# Patient Record
Sex: Male | Born: 1935 | Race: White | Hispanic: No | Marital: Married | Smoking: Never smoker
Health system: Southern US, Community
[De-identification: ages and names within clinical notes are randomized; demographics above are authoritative.]

## PROBLEM LIST (undated history)

## (undated) DIAGNOSIS — I259 Chronic ischemic heart disease, unspecified: Secondary | ICD-10-CM

## (undated) DIAGNOSIS — E785 Hyperlipidemia, unspecified: Secondary | ICD-10-CM

## (undated) DIAGNOSIS — I10 Essential (primary) hypertension: Secondary | ICD-10-CM

## (undated) DIAGNOSIS — I491 Atrial premature depolarization: Secondary | ICD-10-CM

## (undated) DIAGNOSIS — I493 Ventricular premature depolarization: Secondary | ICD-10-CM

## (undated) DIAGNOSIS — I350 Nonrheumatic aortic (valve) stenosis: Secondary | ICD-10-CM

## (undated) DIAGNOSIS — R413 Other amnesia: Secondary | ICD-10-CM

## (undated) DIAGNOSIS — I519 Heart disease, unspecified: Secondary | ICD-10-CM

## (undated) HISTORY — DX: Nonrheumatic aortic (valve) stenosis: I35.0

## (undated) HISTORY — PX: TONSILLECTOMY AND ADENOIDECTOMY: SUR1326

## (undated) HISTORY — DX: Essential (primary) hypertension: I10

## (undated) HISTORY — DX: Chronic ischemic heart disease, unspecified: I25.9

## (undated) HISTORY — DX: Ventricular premature depolarization: I49.3

## (undated) HISTORY — DX: Other amnesia: R41.3

## (undated) HISTORY — DX: Hyperlipidemia, unspecified: E78.5

## (undated) HISTORY — DX: Heart disease, unspecified: I51.9

## (undated) HISTORY — DX: Atrial premature depolarization: I49.1

---

## 1998-04-01 ENCOUNTER — Inpatient Hospital Stay (HOSPITAL_COMMUNITY): Admission: EM | Admit: 1998-04-01 | Discharge: 1998-04-04 | Payer: Self-pay | Admitting: *Deleted

## 1998-10-18 ENCOUNTER — Encounter: Payer: Self-pay | Admitting: Cardiology

## 1998-10-18 ENCOUNTER — Ambulatory Visit (HOSPITAL_COMMUNITY): Admission: RE | Admit: 1998-10-18 | Discharge: 1998-10-18 | Payer: Self-pay | Admitting: Cardiology

## 1999-10-15 HISTORY — PX: CORONARY ARTERY BYPASS GRAFT: SHX141

## 2000-04-10 ENCOUNTER — Ambulatory Visit (HOSPITAL_COMMUNITY): Admission: RE | Admit: 2000-04-10 | Discharge: 2000-04-10 | Payer: Self-pay | Admitting: Cardiology

## 2000-04-10 HISTORY — PX: CARDIAC CATHETERIZATION: SHX172

## 2000-04-24 ENCOUNTER — Encounter: Payer: Self-pay | Admitting: Surgery

## 2000-04-28 ENCOUNTER — Encounter: Payer: Self-pay | Admitting: Surgery

## 2000-04-28 ENCOUNTER — Inpatient Hospital Stay (HOSPITAL_COMMUNITY): Admission: RE | Admit: 2000-04-28 | Discharge: 2000-05-02 | Payer: Self-pay | Admitting: Surgery

## 2000-04-29 ENCOUNTER — Encounter: Payer: Self-pay | Admitting: Surgery

## 2000-05-01 ENCOUNTER — Encounter: Payer: Self-pay | Admitting: Surgery

## 2000-06-24 ENCOUNTER — Encounter (HOSPITAL_COMMUNITY): Admission: RE | Admit: 2000-06-24 | Discharge: 2000-09-22 | Payer: Self-pay | Admitting: Cardiology

## 2002-01-27 ENCOUNTER — Encounter: Payer: Self-pay | Admitting: Surgery

## 2002-01-28 ENCOUNTER — Encounter (INDEPENDENT_AMBULATORY_CARE_PROVIDER_SITE_OTHER): Payer: Self-pay | Admitting: Specialist

## 2002-01-28 ENCOUNTER — Ambulatory Visit (HOSPITAL_COMMUNITY): Admission: RE | Admit: 2002-01-28 | Discharge: 2002-01-28 | Payer: Self-pay | Admitting: Surgery

## 2002-02-25 ENCOUNTER — Encounter (INDEPENDENT_AMBULATORY_CARE_PROVIDER_SITE_OTHER): Payer: Self-pay | Admitting: *Deleted

## 2002-02-25 ENCOUNTER — Encounter: Payer: Self-pay | Admitting: Surgery

## 2002-02-25 ENCOUNTER — Inpatient Hospital Stay (HOSPITAL_COMMUNITY): Admission: RE | Admit: 2002-02-25 | Discharge: 2002-02-26 | Payer: Self-pay | Admitting: Surgery

## 2005-06-10 ENCOUNTER — Ambulatory Visit (HOSPITAL_COMMUNITY): Admission: RE | Admit: 2005-06-10 | Discharge: 2005-06-10 | Payer: Self-pay | Admitting: Gastroenterology

## 2007-04-29 HISTORY — PX: US ECHOCARDIOGRAPHY: HXRAD669

## 2007-08-24 HISTORY — PX: CARDIOVASCULAR STRESS TEST: SHX262

## 2010-05-21 ENCOUNTER — Encounter: Admission: RE | Admit: 2010-05-21 | Discharge: 2010-05-21 | Payer: Self-pay | Admitting: Cardiology

## 2010-05-21 ENCOUNTER — Ambulatory Visit: Payer: Self-pay | Admitting: Cardiology

## 2010-10-25 ENCOUNTER — Encounter
Admission: RE | Admit: 2010-10-25 | Discharge: 2010-10-25 | Payer: Self-pay | Source: Home / Self Care | Attending: Cardiology | Admitting: Cardiology

## 2010-10-26 ENCOUNTER — Ambulatory Visit: Payer: Self-pay | Admitting: Cardiology

## 2010-11-05 ENCOUNTER — Other Ambulatory Visit: Payer: Self-pay | Admitting: Dermatology

## 2010-11-22 ENCOUNTER — Ambulatory Visit (INDEPENDENT_AMBULATORY_CARE_PROVIDER_SITE_OTHER): Payer: Medicare Other | Admitting: Cardiology

## 2010-11-22 DIAGNOSIS — E7800 Pure hypercholesterolemia, unspecified: Secondary | ICD-10-CM

## 2010-11-22 DIAGNOSIS — I119 Hypertensive heart disease without heart failure: Secondary | ICD-10-CM

## 2010-11-22 DIAGNOSIS — Z79899 Other long term (current) drug therapy: Secondary | ICD-10-CM

## 2011-02-04 ENCOUNTER — Other Ambulatory Visit: Payer: Self-pay | Admitting: *Deleted

## 2011-02-04 DIAGNOSIS — F0390 Unspecified dementia without behavioral disturbance: Secondary | ICD-10-CM

## 2011-02-04 MED ORDER — DONEPEZIL HCL 10 MG PO TABS: 10.0000 mg | ORAL_TABLET | Freq: Every day | ORAL | Status: DC

## 2011-02-04 NOTE — Telephone Encounter (Signed)
Refilled meds per fax request.  

## 2011-02-05 ENCOUNTER — Other Ambulatory Visit: Payer: Self-pay | Admitting: *Deleted

## 2011-02-05 DIAGNOSIS — E7800 Pure hypercholesterolemia, unspecified: Secondary | ICD-10-CM

## 2011-02-05 MED ORDER — EZETIMIBE-SIMVASTATIN 10-20 MG PO TABS: 1.0000 | ORAL_TABLET | Freq: Every day | ORAL | Status: DC

## 2011-02-05 NOTE — Telephone Encounter (Signed)
Refilled meds per fax request.  

## 2011-03-01 NOTE — Cardiovascular Report (Signed)
[place]. [facility]  Patient:    Frank Gates, Frank Gates                        MRN: [id number] Proc. Date: 04/10/00 Adm. Date:  [id number] Disc. Date: [id number] Attending:  Norman Clay CC:         Clovis Pu Patty Sermons, M.D.                        Cardiac Catheterization  HISTORY:  A 77 year old male with some fatigue, who had an abnormal stress Cardiolite study with inferolateral ischemia and a depressed ejection fraction.  COMMENTS ABOUT PROCEDURE:  The patient tolerated the procedure well without complications.  Following the procedure the right femoral artery underwent percutaneous suture-meditated closure with the Perclose device.  He tolerated the procedure well.  HEMODYNAMIC DATA: 1. Aortic Pressure:  (post-contrast) 166/86. 2. Left ventricular pressure:  (post-contrast) 166/15.  ANGIOGRAPHIC DATA:  LEFT VENTRICULOGRAM:  Performed in the 30-degree RAO projection.  The ventricle is mildly dilated.  There is inferobasilar and diaphragmatic hypokinesis noted.  The ejection fraction is estimated at 44-45%.  CORONARY ANGIOGRAPHY:  Coronary arteries arise and distribute normally. Significant calcification noted involving the proximal left main and LAD system, as well as the right coronary artery. 1. LEFT MAIN CORONARY ARTERY:  Calcified.  With a mild distal narrowing    present. 2. LEFT ANTERIOR DESCENDING ARTERY:  Has a moderately severe calcified    ostial stenosis, estimated at 60-70% (which is eccentric).  The first    diagonal branch has a 60-70% ostial narrowing. 3. CIRCUMFLEX:  Has a first marginal branch which has a segmental 80%,    followed by an 80-90% stenosis in a bend.  A second marginal branch    arises.  The circumflex is then totally occluded and fills via    collaterals from the right coronary artery. 4. RIGHT CORONARY ARTERY:  Has a severe 80% proximal stenosis, and an 80%    distal stenosis prior to the posterior descending  artery.  IMPRESSION: 1. Significant three-vessel coronary atherosclerotic heart disease. 2. Abnormal left ventricular function, with inferobasilar hypokinesis. DD:  04/10/00 TD:  04/11/00 Job: [id number]

## 2011-03-01 NOTE — H&P (Signed)
[place]. [facility]  Patient:    Frank Gates, Frank Gates                        MRN: [id number] Adm. Date:  [id number] Attending:  Cleatrice Burke Dictator:   Frank Gates, P.A. CC:         Frank Gates, M.D.             Dr. Alben Spittle                         History and Physical  CHIEF COMPLAINT:  Multiple lung masses.  HISTORY OF PRESENT ILLNESS:  [name] referred by Dr. Jimmy Gates for evaluation of a pulmonary mass.  Patient was seen at [facility] secondary to problems related to fibromyalgia. An incidental chest x-ray revealed this abnormality on April 23, 2000.  A CT revealed multiple pleural base nodules, the largest of 2 cm at the anterior right base.  Also some intraparenchymal nodules measuring less than 1 cm were seen, as well, multiple lymphadenopathy in the perivascular space of less than 1 cm was observed.  The patient was referred to Dr. Edwyna Gates for evaluation. PFTs at the office showed an FEV1 of 4.72 and FBC of 5.52.  Dr. Edwyna Gates recommended left VATS with biopsy x 3 for pathologic evaluation.  Differential diagnosis include granulomatosis versus inflammatory lung disease, i.e., rheumatoid arthritis versus sarcoidosis or possible metastasis.  The patient returns to the office prior to the admission, denying any cough, sputum production, hemoptysis, fever or chills.  No sore throat, angina or arrhythmias.  No palpitations.  No symptoms of TIA, CVA or amaurosis fugax. No shortness of breath, dyspnea on exertion, or paroxysmal nocturnal dyspnea. No weight loss.  PAST MEDICAL HISTORY: 1. Fibromyalgia. 2. Arthritis. 3. Morbid obesity.  SURGERIES:  Status post vasectomy in 1999.  MEDICATIONS:  None.  ALLERGIES:  No known drug allergies.  REVIEW OF SYSTEMS:  See HPI and past medical history for significant positives; otherwise, the patient denies any diabetes, kidney disease, asthma, COPD, TIA, CVA, amaurosis  fugax, syncope, presyncope, CAD, angina, or arrhythmias.  No MI, PE, DVT.  No GI bleed, dysuria, or hematuria.  No GERD symptoms.  No CHF or hypertension.  No hypercholesterolemia.  No pneumonia, pneumothorax or bronchitis.  FAMILY HISTORY:  Noncontributory.  The patients parents both died when he was a child.  SOCIAL HISTORY:  Married, two children, one died secondary to leukemia.  He works as a [employer] manager.  He denies any tobacco intake, although for quite some time, he was a second-hand smoker at the factory.  He denies any alcohol intake.  PHYSICAL EXAMINATION:  GENERAL:  Well-developed, well-nourished 77 year old white male in no acute distress.  Alert and oriented x 3.  VITAL SIGNS:  Blood pressure 120/80, pulse 68, respirations 18.  HEAD:  Normocephalic, atraumatic.  PERRLA, EOMI.  There is mild contact dermatitis on the forehead secondary to the use of a hat on a continuous basis.  NECK: Supple, no JVD, no thyromegaly or lymphadenopathy.  CHEST:  Symmetrical on inspirations with no wheezes, rhonchi, there is a trace of dry rales on the right side.  No lymphadenopathy.  CARDIOVASCULAR:  Regular rate and rhythm with no murmurs, rubs or gallops.  ABDOMEN:  Soft, nontender, bowel sounds x 4, no hepatosplenomegaly or abdominal bruits.  GU/RECTAL:  Deferred.  EXTREMITIES:  No clubbing, cyanosis.  There is  trace edema, TENS, bilaterally.  SKIN:  Revealed no ulcerations.  Normal hair pattern.  Warm temperature.  PULSES:  Carotids 2+ bilaterally without bruits.  Femorals 2+ bilaterally. Popliteal dorsalis pedis and posterior tibialis 2+ bilaterally.  NEUROLOGIC:  Grossly normal.  Normal gait.  DTRs 2+ bilaterally.  Muscle strength 5/5.  ASSESSMENT/PLAN:  A 77 year old white male with found multiple masses, who will undergo left VATS and biopsy x 3 by Dr. Edwyna Gates on May 05, 2000. Dr. Edwyna Gates has seen and evaluated this patient prior to the admission and  has explained the risks, benefits involved in the procedure and the patient has decided to continue. DD:  05/01/00 TD:  05/01/00 Job: [id number]

## 2011-03-01 NOTE — Discharge Summary (Signed)
[place]. [facility]  Patient:    Frank Gates, SALMONS                        MRN: [id number] Adm. Date:  [id number] Disc. Date: 05/02/00 Attending:  Cleatrice Burke Dictator:   Marlowe Kays, P.A. CC:         Alleen Borne, M.D.                           Discharge Summary  DATE OF BIRTH:  03-23-1926  DATE OF SURGERY:  April 28, 2000  DISCHARGE DIAGNOSES: 1. Coronary artery disease status post coronary artery bypass graft. 2. History of hypertension. 3. History of gastroesophageal reflux disease symptoms. 4. Postoperative anemia. 5. Peripheral edema.  PROCEDURES:  Status post CABG times six with LIMA to the LAD, SVG to the diagonal, sequential SVG to the OM1 to the OM2 to the distal circumflex and SVG to the PDA.  DISCHARGE MEDICATIONS:  Lasix 20 mg one p.o. for three days.  K-Dur 10 mEq one p.o. q.d. for three days. Pepcid 20 mg one p.o. b.i.d.  Atenolol 12.5 mg one p.o. b.i.d.  Ferrous sulfate 325 mg p.o. b.i.d.  Multivitamins with folic acid q.d.  Darvocet N 100 one or two p.o. q.4-6h. p.r.n. for pain.  COMPLICATIONS:  None.  CONSULTATIONS:  None.  ALLERGIES:  No known drug allergies.  FOLLOW-UP:  With Dr. Patty Sermons in two weeks.  With Dr. Laneta Simmers May 20, 2000 at 9:15 a.m.  The patient will bring chest x-rays with him.  HISTORY OF PRESENT ILLNESS:  A 77 year old white male who is very active.  He had a routine history and physical recently, and related that he was not feeling up to his normal self with some fatigue and malaise.  He had no chest pain or shortness of breath.  Cardiolite scan showed left ventricular ejection fraction reduced to 30%, with a small area of inferolateral ischemia.  He had underwent cardiac catheterization on April 10, 2000 which showed significant three vessel coronary artery disease.  His ejection fraction was 45%.  After review of the angiograms and examination of the patient by Dr. Laneta Simmers, it was felt that  coronary artery bypass grafting was the best treatment.  HOSPITAL COURSE:  On April 28, 2000, the patient underwent CABG times six, with a LIMA to the LAD SVG to the diagonal and sequential SVG to the OM1, OM2 and distal circumflex and SVG to the PDA by Dr. Laneta Simmers with no complications.  The patient tolerated the procedure well and was transferred to the SICU in stable condition.  Later that evening, he was awake and extubated.  His CT drainage was minimal.  His blood count was normal.  He remained stable the morning of April 29, 2000.  He was doing well status post CABG.  His MTs were discontinued.  The left pleural tube was kept for another day.  Diuresis was to be continued.  He was transferred to the unit 2000 in stable condition.  On April 30, 2000 the patient remained stable.  His vital signs were stable. He was afebrile. His saturation was about 90% on room air.  He had minimal CT drainage, for which his CT was discontinued.  His telemetry showed sinus rhythm.  Overall he was doing well, with the exception of slight postoperative anemia, with a hemoglobin of 9 and a hematocrit of 25.3.  Ferrous  sulfate therapy was started.  He continued to ambulate, with no complications.  On May 01, 2000 the patients condition continued to improve.  His vital signs were stable.  His telemetry showed sinus rhythm and he was afebrile.  He is slowly returning to his preoperative weight of 194.  If the patient remains in stable condition on May 02, 2000, then his pacing wires will be removed and if no abnormalities arise it is predicted that the patient will be discharged in stable condition on postoperative day four. DD:  05/01/00 TD:  05/01/00 Job: [id number]

## 2011-03-01 NOTE — Op Note (Signed)
[place]. [facility]  Patient:    Frank Gates, Frank Gates                        MRN: [id number] Proc. Date: 04/28/00 Adm. Date:  [id number] Attending:  Cleatrice Burke CC:         Alleen Borne, M.D.             CVTS Office                           Operative Report  PREOPERATIVE DIAGNOSIS:  Severe three-vessel coronary artery disease with ischemia by Cardiolite scan.  POSTOPERATIVE DIAGNOSIS:  Severe three-vessel coronary artery disease with ischemia by Cardiolite scan.  OPERATIVE PROCEDURE: 1. Median sternotomy. 2. Extracorporeal circulation. 3. Coronary bypass graft surgery x 6 using the left internal mammary artery    graft, left anterior descending coronary artery, with saphenous vein    grafts to the diagonal branch of the LAD, a sequential saphenous vein graft    to the first and second obtuse marginal branches and then the distal left    circumflex coronary artery, and a saphenous vein graft to the posterior    descending branch of the right coronary artery.  ATTENDING SURGEON:  Alleen Borne, M.D.  ASSISTANT:  Lissa Merlin, P.A.-C.  ANESTHESIA:  General endotracheal.  CLINICAL HISTORY:  This patient is a 77 year old white male who is very active.  He had a routine history and physical recently and related that he was not feeling up to his normal self with some fatigue and malaise.  He had no chest pain or shortness of breath.  A Cardiolite scan showed left ventricular ejection fraction reduced to 30% with a small area of intralateral ischemia.  He underwent cardiac catheterization on April 10, 2000 which showed significant three-vessel coronary artery disease.  The left main was calcified with mild distal narrowing.  The LAD had 60-70% calcified eccentric ostial stenosis. First diagonal branch had 60-70% osteal narrowing.  The left circumflex had a first marginal and segmental 80% stenosis followed by an 80-90% stenosis in the bend of the left  circumflex before the second marginal branch.  The left circumflex was totally occluded beyond this with faint collateral from the right filling the distal vessel.  The right coronary artery had about 80% proximal stenosis and an 80% distal stenosis prior to the posterior descending branch.  Ejection fraction was 45%.  After review of the angiograms and examination of the patient it was felt that coronary artery bypass graft surgery was the best treatment.  I discussed the operative procedure with the patient and his wife including alternatives, benefits, risks including bleeding, possible blood transfusion, infection, stroke, myocardial infarction, and death.  They understood and agreed to proceed with surgery.  PROCEDURE IN DETAIL:  The patient was taken to the operating room and placed on the table in the supine position.  After induction of general endotracheal anesthesia a Foley catheter was placed in the bladder using the sterile technique.  Then, the chest, abdomen and both lower extremities were prepped and draped in the usual sterile manner.  Chest was entered through a median sternotomy incision and the pericardial muscle in the midline.  Examination of the heart showed good ventricular contractility.  The ascending aorta had no ostial stenosis in it.  Then the left internal mammary artery was harvested from the chest wall using pedicle graft.  This was a medium caliber with excellent blood flow through it.  At the same time, a 7 mm saphenous vein was harvested from the right leg and was of medium size and good quality.  Then the patient was heparinized and when an adequate axillary ______ was achieved he distal ascending aorta was cannulated using a 6.5 mm aortic cannula for arterial inflow.  Venous outflow was achieved using a two-stage venous cannula through the right atrial appendage.  An antegrade cardioplegia and vent cannula was inserted in the aortic root.  The  patient was placed on cardiopulmonary bypass and distal coronaries identified.  The LAD was a large graftable vessel.  The diagonal branch was a medium-sized graftable vessel.  The two marginal branches were lying close together, were medium sized graftable vessels with no distal disease in them. The distal left circumflex was a small vessel but was suitable for grafting. the right coronary artery was diffusely diseased extending up to the take-off of the posterior descending branch.  The posterior descending branch itself had no disease in it.  There were some small posterior lateral branches.  Then, the aorta was crossclamped and 500 cc of cold blood antegrade cardioplegia was administered in the aortic root with quick arrest of the heart.  Systemic hypothermia of 22 degrees centigrade and topical hypothermia iced saline was used.  A temperature probe was placed in the septum and an insulating pad in the pericardium.  The first distal anastomosis was performed of the first marginal branch.  The internal diameter was 1.6 mm.  The conduit used was 7 mm of saphenous vein. The anastomosis was formed in a sequential side-to-side manner using continuous 8-0 Prolene suture.  Flow was administered through the graft and was excellent.  The second distal anastomosis was performed of the second marginal branch. The internal diameter was 1.6 mm.  The conduit used was the same 7 mm saphenous vein.  The anastomosis was formed in a sequential side-to-side manner using continuous 8-0 Prolene suture.  Flow was again administered through the graft and was excellent.  Then, the third distal anastomosis was performed of the distal left circumflex coronary artery.  The internal diameter of this vessel was about 1.5 mm.  The conduit was the same 7 mm saphenous vein.  The anastomosis was performed in a sequential end-to-side manner using continuous 8-0 Prolene suture.  Flow was administered through the  graft and was excellent.  Then another dose of  cardioplegia was coming down the vein graft and in the aortic root.  The fourth distal anastomosis was then performed to the posterior descending branch of the right coronary artery.  The internal diameter was 1.75 mm.  The conduit was a second 7 mm saphenous vein.  The anastomosis was formed in an end-to-side manner using continuous 7-0 Prolene suture.  The flow was administered through the graft and was excellent.  Then, the fifth distal anastomosis was performed of the diagonal branch.  The internal diameter was 1.6 mm. The conduit used was a third 7 mm saphenous vein.  The anastomosis was formed in an end-to-side manner using continuous 7-0 Prolene suture.  Flow was administered through the graft and was excellent. Then another dose of cardioplegia was given down the vein grafts and in the aortic root.  The sixth distal anastomosis was performed in the midportion of the left anterior descending coronary artery.  The internal diameter was about 2 mm. The conduit used was the left internal mammary artery and this  was brought through an opening in the left pericardium anterior to the phrenic nerve.  It was anastomosed to the LAD in end-to-side manner using continuous 8-0 Prolene suture. The pedicle hole was tacked up with 6-0 Prolene sutures.  The patient was rewarmed to 37 degrees centigrade and clamp removed from the mammary pedicle.  There was rapid warming of the ventricular system and return of spontaneous ventricular fibrillation.  The crossclamp was removed with the time of 73 minutes and the patient defibrillated into sinus rhythm.  A partial occlusion clamp was placed on the artery root and the three crossal vein graft anastomoses were performed in an end-to-side manner using continuous suture of Prolene suture.  The clamp was removed and the vein graft deaired and clamps removed from them.  The proximal and distal  anastomosis appeared hemostatic and lie of the grafts satisfactory.  Graft markers placed on the proximal anastomoses.  Two temporary right ventricular and right atrial pacing wires were placed and brought out through the skin.  When the patient had rewarmed to 37 degrees centigrade, he was weaned from cardiopulmonary bypass on noninotropic agents.  Total bypass time was 112 minutes.  Cardiac function appeared excellent with cardiac output of 5 L/minute. Protamine was given and then venous and aortic cannulas were removed without difficulty.  Hemostasis was achieved.  Three chest tubes were placed with tubes in the posterior pericardium and one in the left pleural space and one in the anterior mediastinum.  The pericardium was reapproximated over the heart.  The sternum was closed with #6 stainless steel wire.  The fascia was closed with continuous #1 Vicryl suture.  The subcutaneous tissue was closed with continuous 2-0 Vicryl and the skin with 3-0 Vicryl subcuticular closure. The lower extremity vein harvest site was closed in a similar manner.  The sponge, needle and instrument counts were correct according to the scrub nurse.  Dry sterile dressings were applied over the incisions, around the chest tubes which were hooked to Pleur-evac suction.  The patient remained hemodynamically stable and was transported to the SICU in guarded but stable condition. DD:  04/28/00 TD:  04/28/00 Job: [id number]

## 2011-03-01 NOTE — Op Note (Signed)
[place]. [facility]  Patient:    Frank Gates, Frank Gates Visit Number: [id number] MRN: [id number]          Service Type: SUR Location: 2000 2017 01 Attending Physician:  Cleatrice Burke Dictated by:   Alleen Borne, M.D. Proc. Date: 02/25/02 Admit Date:  02/25/2002                             Operative Report  PREOPERATIVE DIAGNOSIS:  Right anterior chest wall mass.  POSTOPERATIVE DIAGNOSIS:  Right anterior chest wall mass.  OPERATION PERFORMED:  Resection of right anterior chest wall mass.  SURGEON:  Alleen Borne, M.D.  ASSISTANT:  Norton Blizzard, M.D.  ANESTHESIA:  General endotracheal.  INDICATIONS FOR PROCEDURE:  The patient is a 77 year old gentleman who underwent coronary artery bypass grafting surgery by me on April 28, 2000.  I saw him on January 26, 2002 after referral for development of an asymptomatic right anterior chest wall mass.  A CT scan of the chest showed a 3.1 x 4.4 cm soft tissue mass on the right anterior chest wall that appeared to be involving muscular layer in the chest wall and extending down between the ribs adjacent to the right side of the sternum.  There was no sign of invasion of the surrounding bones.  I performed an incisional biopsy of this on January 28, 2002.  This showed a spindle cell lesion most consistent with hypertrophic scar but Dr. Laurena Bering could not be certain that this was not a neurofibroma. There were no signs of malignancy.  I thought the best treatment for this would be surgical resection to prevent further growth and local invasion.  I discussed the operative procedure with the patient and his wife including alternatives, benefits, and risks including bleeding, blood transfusion, infection, stroke, myocardial infarction, need for reconstruction of the chest wall, and recurrence.  They understood and agreed to proceed.  DESCRIPTION OF PROCEDURE:  The patient was taken to the operating room and placed  on the table in the supine position.  After induction of general endotracheal anesthesia, a Foley catheter was placed in the bladder using sterile technique.  Then the chest was prepped with Betadine soap and solution and draped in the usual sterile manner.  Preoperative intravenous antibiotics were given.  Then a vertical incision was made and centered over the right anterior chest wall mass.  Dissection was continued down through the subcutaneous tissues to the pectoralis muscle.  Skin flaps were developed medially and laterally.  The pectoralis muscle was then divided horizontally along the course of its fibers.  Over the central part of the tumor, the muscle was adherent to it, probably from the previous biopsy.  Part of the pectoralis muscle was resected with the specimen in this area.  Otherwise the pectoralis muscle was easily separated from the chest wall mass.  The mass did not appear to be invading any structures.  It was firmly insinuated between the rib above and below it as well as the sternum medially.  Using electrocautery, we were able to excise this tumor, mass from the ribs and sternum without difficulty.  There did not appear to be any local invasion. The mass extended down to the intercostal muscle and in one area, I could visualize the right internal mammary artery pedicle at the depth of the resection.  The specimen was sent to pathology for permanent section.  There was complete hemostasis.  The pectoralis muscle was then reapproximated.  A small round Jackson-Pratt drain was brought through a separate stab incision and positioned above the muscle under the subcutaneous flaps.  Then the subcutaneous tissues was closed with continuous 3-0 Vicryl suture and the skin with 3-0 Vicryl subcuticular skin closure.  The sponge, needle and instrument counts were correct according to the scrub nurse.  A dry sterile dressing was applied over the incisions and around the drain  which was hooked to bulb suction.  The patient remained hemodynamically stable and was awakened and transported to the post anesthesia care unit extubated and in good condition. Dictated by:   Alleen Borne, M.D. Attending Physician:  Cleatrice Burke DD:  02/25/02 TD:  02/25/02 Job: [id number]

## 2011-03-01 NOTE — Op Note (Signed)
[facility]  Patient:    Frank Gates, LOSEE Visit Number: [id number] MRN: [id number]          Service Type: DSU Location: [facility] 2899 18 Attending Physician:  Cleatrice Burke Dictated by:   Alleen Borne, M.D. Proc. Date: 01/28/02 Admit Date:  01/28/2002 Discharge Date: 01/28/2002                             Operative Report  PREOPERATIVE DIAGNOSIS:  Right anterior chest wall mass.  POSTOPERATIVE DIAGNOSIS:  Right anterior chest wall mass.  OPERATION PERFORMED:  Incisional biopsy of right anterior chest wall mass.  SURGEON:  Alleen Borne, M.D.  ANESTHESIA:  General endotracheal.  INDICATIONS FOR PROCEDURE:  The patient is a 77 year old gentleman who underwent coronary artery bypass grafting surgery by me in the past couple of years.  He has done well postoperatively.  He presented to my office with a couple month history of right anterior chest wall mass that has been slowly enlarging.  This has caused him no symptoms.  CT scan of the chest had been obtained by Dr. Patty Sermons and showed a 4.4 x 3 cm mass that appeared to be within the muscles of the anterior chest wall beneath the subcutaneous tissues.  There did not appear to be any bone destruction but it was adjacent to the sternum.  There were no pulmonary lesions.  There were no pleural effusions.  I felt that this may represent a benign or malignant muscle tumor and should be biopsied.  I discussed the procedure of incisional biopsy with the patient including alternatives, benefits and risks.  Including bleeding and infection.  I also discussed the possibility that this may require further surgery if it was a malignant tumor.  The patient understood and agreed to proceed.  DESCRIPTION OF PROCEDURE:  The patient was taken to the operating room and placed on the table in supine position.  After induction of general endotracheal anesthesia, the anterior chest wall was prepped with  Betadine soap and solution and draped in the usual sterile manner. Then a vertical incision was made over the center of the chest wall mass.  It was carried down through the subcutaneous tissues which appeared normal.  The lower border of the pectoralis major muscle was encountered and appeared normal.  It was separated along the line of the fibers.  Beneath this there was a firm, non-mobile mass present which appeared encapsulated.  An incisional biopsy was taken of this and sent to pathology for frozen and permanent section. Dr. 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  There was complete hemostasis.  The pectoralis major muscle was reapproximated over the biopsy site.  The subcutaneous tissues were closed with interrupted 2-0 Vicryl suture and the skin with staples.  Then 0.25% Marcaine local anesthesia was infiltrated into the surrounding tissue.  The patient was awakened and extubated and transported to the post anesthesia care unit in satisfactory and stable condition. Dictated by:   Alleen Borne, M.D. Attending Physician:  Cleatrice Burke DD:  01/28/02 TD:  01/28/02 Job: [id number]

## 2011-03-01 NOTE — Op Note (Signed)
Frank Gates, Frank Gates                 ACCOUNT NO.:  [id number]   MEDICAL RECORD NO.:  [id number]          PATIENT TYPE:  AMB   LOCATION:  ENDO                         FACILITY:  [facility]   PHYSICIAN:  John C. Madilyn Fireman, M.D.    DATE OF BIRTH:  [birth year]   DATE OF PROCEDURE:  06/10/2005  DATE OF DISCHARGE:                                 OPERATIVE REPORT   PROCEDURE:  Colonoscopy.   INDICATIONS FOR PROCEDURE:  Average risk colon cancer screening.   DESCRIPTION OF PROCEDURE:  The patient was placed in the left lateral  decubitus position and placed on the pulse monitor with continuous low-flow  oxygen delivered by nasal cannula. He was sedated with 70 mcg of intravenous  fentanyl and 7 milligrams of intravenous Versed. The Olympus video  colonoscope was inserted into the rectum and advanced to the cecum,  confirmed by transillumination of McBurney's point and visualization of the  ileocecal valve and appendiceal orifice. The prep was excellent. The cecum,  ascending, and transverse colon all appeared normal with no masses, polyps,  diverticula, or other mucosal abnormalities. Within the descending sigmoid  colon, there were seen several scattered diverticula and no other  abnormalities. The rectum appeared normal and retroflexed view of the anus  revealed no obvious internal hemorrhoids. The scope was then withdrawn and  the patient returned to the recovery room in stable condition. He tolerated  procedure well and there were no immediate complications.   IMPRESSION:  Diverticulosis, otherwise normal study.   PLAN:  Repeat colonoscopy within 10 years and consider sigmoidoscopy or  Hemoccults in 5 years.           ______________________________  Everardo All Madilyn Fireman, M.D.     J[facility]/MEDQ  D:  06/10/2005  T:  06/10/2005  Job:  [id number]   cc:   Cassell Clement, M.D.  [address]  [place]  [place] [address]  Fax: [phone]

## 2011-05-14 ENCOUNTER — Other Ambulatory Visit: Payer: Self-pay | Admitting: Cardiology

## 2011-05-14 MED ORDER — LISINOPRIL 10 MG PO TABS: 10.0000 mg | ORAL_TABLET | Freq: Every day | ORAL | Status: DC

## 2011-05-14 NOTE — Telephone Encounter (Signed)
Med refill for prinivil 10mg 

## 2011-05-21 ENCOUNTER — Encounter: Payer: Self-pay | Admitting: Nurse Practitioner

## 2011-05-29 ENCOUNTER — Encounter: Payer: Self-pay | Admitting: Nurse Practitioner

## 2011-05-29 ENCOUNTER — Ambulatory Visit
Admission: RE | Admit: 2011-05-29 | Discharge: 2011-05-29 | Disposition: A | Discharge status: Home / Self Care | Payer: Medicare Other | Source: Ambulatory Visit | Attending: Nurse Practitioner | Admitting: Nurse Practitioner

## 2011-05-29 ENCOUNTER — Ambulatory Visit (INDEPENDENT_AMBULATORY_CARE_PROVIDER_SITE_OTHER): Payer: Medicare Other | Admitting: Nurse Practitioner

## 2011-05-29 ENCOUNTER — Ambulatory Visit (INDEPENDENT_AMBULATORY_CARE_PROVIDER_SITE_OTHER): Payer: Medicare Other | Admitting: *Deleted

## 2011-05-29 VITALS — BP 138/88 | HR 68 | Ht 72.0 in | Wt 203.6 lb

## 2011-05-29 DIAGNOSIS — R918 Other nonspecific abnormal finding of lung field: Secondary | ICD-10-CM

## 2011-05-29 DIAGNOSIS — R413 Other amnesia: Secondary | ICD-10-CM | POA: Insufficient documentation

## 2011-05-29 DIAGNOSIS — E119 Type 2 diabetes mellitus without complications: Secondary | ICD-10-CM

## 2011-05-29 DIAGNOSIS — I259 Chronic ischemic heart disease, unspecified: Secondary | ICD-10-CM

## 2011-05-29 DIAGNOSIS — Z79899 Other long term (current) drug therapy: Secondary | ICD-10-CM

## 2011-05-29 DIAGNOSIS — I10 Essential (primary) hypertension: Secondary | ICD-10-CM | POA: Insufficient documentation

## 2011-05-29 DIAGNOSIS — R9389 Abnormal findings on diagnostic imaging of other specified body structures: Secondary | ICD-10-CM

## 2011-05-29 DIAGNOSIS — I350 Nonrheumatic aortic (valve) stenosis: Secondary | ICD-10-CM | POA: Insufficient documentation

## 2011-05-29 DIAGNOSIS — E785 Hyperlipidemia, unspecified: Secondary | ICD-10-CM

## 2011-05-29 DIAGNOSIS — R5383 Other fatigue: Secondary | ICD-10-CM

## 2011-05-29 DIAGNOSIS — I2510 Atherosclerotic heart disease of native coronary artery without angina pectoris: Secondary | ICD-10-CM

## 2011-05-29 DIAGNOSIS — R635 Abnormal weight gain: Secondary | ICD-10-CM

## 2011-05-29 LAB — LIPID PANEL
HDL: 46.7 mg/dL (ref >39.00)
LDL Cholesterol: 57 mg/dL (ref 0–99)
VLDL: 22.4 mg/dL (ref 0.0–40.0)

## 2011-05-29 LAB — COMPREHENSIVE METABOLIC PANEL
AST: 22 U/L (ref 0–37)
Albumin: 4.2 g/dL (ref 3.5–5.2)
Alkaline Phosphatase: 62 U/L (ref 39–117)
BUN: 22 mg/dL (ref 6–23)
Potassium: 4.9 mEq/L (ref 3.5–5.1)
Sodium: 145 mEq/L (ref 135–145)
Total Bilirubin: 1 mg/dL (ref 0.3–1.2)

## 2011-05-29 LAB — HEMOGLOBIN A1C: Hgb A1c MFr Bld: 5.6 % (ref 4.6–6.5)

## 2011-05-29 NOTE — Assessment & Plan Note (Signed)
He has had remote CABG. He remains asymptomatic. Last nuclear was in 2008.

## 2011-05-29 NOTE — Progress Notes (Signed)
    Frank Gates Date of Birth: 1926-01-04   History of Present Illness: Frank Gates is seen back today for his 6 month visit. He is seen for Dr. Patty Sermons. He is 77 years of age. He has known CAD with remote CABG. He is doing well. He continues to have issues with his memory. He answers "I don't remember" to most of my questions. He does deny chest pain. He says he is not short of breath. He is tolerating his medicines. He does not remember if he has taken them this morning. He is fasting for his labs. 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 He has moved to a retirement center since his last visit. He does have a chronically abnormal CXR.   Current Outpatient Prescriptions on File Prior to Visit  Medication Sig Dispense Refill  . aspirin 81 MG tablet Take 81 mg by mouth daily.        Marland Kitchen donepezil (ARICEPT) 10 MG tablet Take 1 tablet (10 mg total) by mouth daily.  30 tablet  11  . ezetimibe-simvastatin (VYTORIN) 10-20 MG per tablet Take 1 tablet by mouth at bedtime.  30 tablet  11  . lisinopril (PRINIVIL,ZESTRIL) 10 MG tablet Take 1 tablet (10 mg total) by mouth daily.  30 tablet  11  . metoprolol tartrate (LOPRESSOR) 25 MG tablet Take 25 mg by mouth 2 (two) times daily.        . Multiple Vitamin (MULTIVITAMIN) tablet Take 1 tablet by mouth daily.          No Known Allergies  Past Medical History  Diagnosis Date  . Hypertension   . Hyperlipidemia   . Memory loss   . IHD (ischemic heart disease)     s/p CABG in 2001  . Asymptomatic PVCs   . PAC (premature atrial contraction)   . LV dysfunction     EF 50% per nuclear in 2008  . Aortic stenosis     Mild to moderate per echo in 2008    Past Surgical History  Procedure Date  . Cardiac catheterization 04/10/2000    EF 44-45%  . Tonsillectomy and adenoidectomy   . Coronary artery bypass graft 2001  . US echocardiography 04/29/2007    EF 55-60%  . Cardiovascular stress test 08/24/2007    EF 50%    History  Smoking status   . Never Smoker   Smokeless tobacco  . Not on file    History  Alcohol Use No    Family History  Problem Relation Age of Onset  . Stroke Mother   . Heart failure Father     Review of Systems: The review of systems is positive for poor memory. He denies chest pain, shortness of breath, or syncope.  All other systems were reviewed and are negative.  Physical Exam: BP 138/88  Pulse 68  Ht 6' (1.829 m)  Wt 203 lb 9.6 oz (92.352 kg)  BMI 27.61 kg/m2 Patient is very pleasant and in no acute distress. Skin is warm and dry. Color is normal.  HEENT is unremarkable. Normocephalic/atraumatic. PERRL. Sclera are nonicteric. Neck is supple. No masses. No JVD. Lungs are clear. Cardiac exam shows a regular rate and rhythm. He has a soft outflow murmur. Abdomen is soft. Extremities are without edema. Gait and ROM are intact. No gross neurologic deficits noted.  LABORATORY DATA: Pending   Assessment / Plan:

## 2011-05-29 NOTE — Assessment & Plan Note (Signed)
He remains on Aricept.

## 2011-05-29 NOTE — Patient Instructions (Signed)
We are going to send you for a CXR today Stay on your current medicines. We will call you with your labs Call for any problems. We will see you back in 6 months.

## 2011-05-29 NOTE — Assessment & Plan Note (Signed)
Last echo in 2008. No cardinal symptoms reported. May consider repeat on next visit.

## 2011-05-29 NOTE — Assessment & Plan Note (Signed)
Blood pressure looks fair. He does not check at home. He was not sure if he had had his medicines today yet. I have left him on his current regimen.

## 2011-05-29 NOTE — Assessment & Plan Note (Signed)
Last study was one year ago. He has some nodules on the left that have been noted on prior study. We will repeat today.

## 2011-05-30 ENCOUNTER — Telehealth: Payer: Self-pay | Admitting: *Deleted

## 2011-05-30 NOTE — Progress Notes (Signed)
These notes were forwarded to pts physician dr Lanier Clam pratt lpn

## 2011-05-30 NOTE — Telephone Encounter (Signed)
Message copied by Eugenia Pancoast on Thu May 30, 2011  5:07 PM ------      Message from: Cassell Clement      Created: Wed May 29, 2011  3:12 PM       Please report.  The chest x-ray looked better.  They think that the shadow is probably now a normal structure.

## 2011-05-30 NOTE — Telephone Encounter (Signed)
Advised patient of CXR results

## 2011-06-13 ENCOUNTER — Telehealth: Payer: Self-pay | Admitting: *Deleted

## 2011-06-13 NOTE — Telephone Encounter (Signed)
Labs results were sent to Dr Elease Hashimoto in error.   Dr. Patty Sermons reviewed and mailed to patient.  Patients wife recently passed.

## 2011-07-03 ENCOUNTER — Telehealth: Payer: Self-pay | Admitting: Cardiology

## 2011-07-03 NOTE — Telephone Encounter (Signed)
Pt's daughter wanted to talk you about father's care. She is his POA. Please call

## 2011-09-19 ENCOUNTER — Telehealth: Payer: Self-pay | Admitting: Cardiology

## 2011-09-19 NOTE — Telephone Encounter (Signed)
Scheduled with Dr Penni Bombard (he has seen in past) Monday at 2:00

## 2011-09-19 NOTE — Telephone Encounter (Signed)
New Msg: Pt daughter calling wanting referral from Dr. Patty Sermons for pt "sore hip" and limp. Please return pt daughter call to discuss further.

## 2011-09-19 NOTE — Telephone Encounter (Signed)
Advised daughter of appointment

## 2011-10-22 DIAGNOSIS — M25559 Pain in unspecified hip: Secondary | ICD-10-CM | POA: Diagnosis not present

## 2011-11-26 ENCOUNTER — Ambulatory Visit (INDEPENDENT_AMBULATORY_CARE_PROVIDER_SITE_OTHER): Payer: Medicare Other | Admitting: Cardiology

## 2011-11-26 ENCOUNTER — Encounter: Payer: Self-pay | Admitting: Cardiology

## 2011-11-26 VITALS — BP 105/75 | HR 81 | Ht 72.0 in | Wt 178.8 lb

## 2011-11-26 DIAGNOSIS — I10 Essential (primary) hypertension: Secondary | ICD-10-CM

## 2011-11-26 DIAGNOSIS — E7800 Pure hypercholesterolemia, unspecified: Secondary | ICD-10-CM | POA: Diagnosis not present

## 2011-11-26 DIAGNOSIS — I2510 Atherosclerotic heart disease of native coronary artery without angina pectoris: Secondary | ICD-10-CM | POA: Diagnosis not present

## 2011-11-26 DIAGNOSIS — R413 Other amnesia: Secondary | ICD-10-CM | POA: Diagnosis not present

## 2011-11-26 DIAGNOSIS — I350 Nonrheumatic aortic (valve) stenosis: Secondary | ICD-10-CM

## 2011-11-26 DIAGNOSIS — I359 Nonrheumatic aortic valve disorder, unspecified: Secondary | ICD-10-CM

## 2011-11-26 DIAGNOSIS — Z951 Presence of aortocoronary bypass graft: Secondary | ICD-10-CM

## 2011-11-26 NOTE — Assessment & Plan Note (Signed)
His family which is his son and his daughter feel that his snoring has improved now that his son is staying with him and that he is having interaction with his grandson.  They would like to have him not take Aricept for a while and this will be okay.

## 2011-11-26 NOTE — Assessment & Plan Note (Signed)
Patient has mild aortic stenosis.  He is having no symptoms from his aortic stenosis.

## 2011-11-26 NOTE — Patient Instructions (Signed)
Discontinue your Aricept for now Your physician wants you to follow-up in: 6 months You will receive a reminder letter in the mail two months in advance. If you don't receive a letter, please call our office to schedule the follow-up appointment.

## 2011-11-26 NOTE — Progress Notes (Signed)
Frank Gates Date of Birth:  [birth year] [facility] [address] [place], [place]  [address] [phone]         Fax   [phone]  History of Present Illness: This pleasant 78 year old recent widower is seen for a six-month followup office visit.  He has a history of ischemic heart disease and had coronary artery bypass graft surgery in July 2001.  He has a history of essential hypertension and a history of dyslipidemia.  He also has some early memory loss.  Current Outpatient Prescriptions  Medication Sig Dispense Refill  . aspirin 81 MG tablet Take 81 mg by mouth daily.        Marland Kitchen ezetimibe-simvastatin (VYTORIN) 10-20 MG per tablet Take 1 tablet by mouth at bedtime.  30 tablet  11  . lisinopril (PRINIVIL,ZESTRIL) 10 MG tablet Take 1 tablet (10 mg total) by mouth daily.  30 tablet  11  . metoprolol tartrate (LOPRESSOR) 25 MG tablet Take 25 mg by mouth 2 (two) times daily.        . Multiple Vitamin (MULTIVITAMIN) tablet Take 1 tablet by mouth daily.          No Known Allergies  Patient Active Problem List  Diagnoses  . CAD (coronary artery disease)  . HTN (hypertension)  . Memory deficit  . Abnormal CXR  . Aortic stenosis    History  Smoking status  . Never Smoker   Smokeless tobacco  . Not on file    History  Alcohol Use No    Family History  Problem Relation Age of Onset  . Stroke Mother   . Heart failure Father     Review of Systems: Constitutional: no fever chills diaphoresis or fatigue or change in weight.  Head and neck: no hearing loss, no epistaxis, no photophobia or visual disturbance. Respiratory: No cough, shortness of breath or wheezing. Cardiovascular: No chest pain peripheral edema, palpitations. Gastrointestinal: No abdominal distention, no abdominal pain, no change in bowel habits hematochezia or melena. Genitourinary: No dysuria, no frequency, no urgency, no nocturia. Musculoskeletal:No arthralgias, no back pain, no gait  disturbance or myalgias. Neurological: No dizziness, no headaches, no numbness, no seizures, no syncope, no weakness, no tremors. Hematologic: No lymphadenopathy, no easy bruising. Psychiatric: No confusion, no hallucinations, no sleep disturbance.    Physical Exam: Filed Vitals:   11/26/11 1007  BP: 105/75  Pulse: 81   the general appearance reveals a well-developed well-nourished gentleman in no distress.Pupils equal and reactive.   Extraocular Movements are full.  There is no scleral icterus.  The mouth and pharynx are normal.  The neck is supple.  The carotids reveal no bruits.  The jugular venous pressure is normal.  The thyroid is not enlarged.  There is no lymphadenopathy.  The chest is clear to percussion and auscultation. There are no rales or rhonchi. Expansion of the chest is symmetrical.  Heart reveals a grade 2/6 systolic ejection murmur at the base.The abdomen is soft and nontender. Bowel sounds are normal. The liver and spleen are not enlarged. There Are no abdominal masses. There are no bruits.  The pedal pulses are good.  There is no phlebitis or edema.  There is no cyanosis or clubbing. Strength is normal and symmetrical in all extremities.  There is no lateralizing weakness.  There are no sensory deficits.  The skin is warm and dry.  There is no rash.    Assessment / Plan:  Continue same medication except stop Aricept for  now.  Recheck in 6 months for followup office visit EKG and fasting lab work.

## 2011-11-26 NOTE — Assessment & Plan Note (Signed)
The patient has not been expressing any dizziness or syncope.  His son is now living with him.  The son make sure that he eats very heart healthy meals and that he gets plenty of regular exercise and that he is taking his medication as prescribed.

## 2011-11-26 NOTE — Assessment & Plan Note (Signed)
The patient has not been experiencing any recurrent chest pain or angina.  He is exercising on a regular basis.

## 2012-05-25 ENCOUNTER — Ambulatory Visit: Payer: Medicare Other | Admitting: Cardiology

## 2012-05-27 ENCOUNTER — Ambulatory Visit: Payer: Medicare Other | Admitting: Cardiology

## 2012-05-28 ENCOUNTER — Other Ambulatory Visit (INDEPENDENT_AMBULATORY_CARE_PROVIDER_SITE_OTHER): Payer: Medicare Other

## 2012-05-28 ENCOUNTER — Ambulatory Visit (INDEPENDENT_AMBULATORY_CARE_PROVIDER_SITE_OTHER): Payer: Medicare Other | Admitting: Cardiology

## 2012-05-28 ENCOUNTER — Encounter: Payer: Self-pay | Admitting: Cardiology

## 2012-05-28 ENCOUNTER — Other Ambulatory Visit: Payer: Self-pay

## 2012-05-28 VITALS — BP 118/80 | HR 81 | Ht 72.0 in | Wt 174.0 lb

## 2012-05-28 DIAGNOSIS — I350 Nonrheumatic aortic (valve) stenosis: Secondary | ICD-10-CM

## 2012-05-28 DIAGNOSIS — R0989 Other specified symptoms and signs involving the circulatory and respiratory systems: Secondary | ICD-10-CM

## 2012-05-28 DIAGNOSIS — I491 Atrial premature depolarization: Secondary | ICD-10-CM

## 2012-05-28 DIAGNOSIS — E7800 Pure hypercholesterolemia, unspecified: Secondary | ICD-10-CM

## 2012-05-28 DIAGNOSIS — I10 Essential (primary) hypertension: Secondary | ICD-10-CM

## 2012-05-28 DIAGNOSIS — Z951 Presence of aortocoronary bypass graft: Secondary | ICD-10-CM | POA: Diagnosis not present

## 2012-05-28 DIAGNOSIS — I2510 Atherosclerotic heart disease of native coronary artery without angina pectoris: Secondary | ICD-10-CM

## 2012-05-28 DIAGNOSIS — I119 Hypertensive heart disease without heart failure: Secondary | ICD-10-CM

## 2012-05-28 DIAGNOSIS — I359 Nonrheumatic aortic valve disorder, unspecified: Secondary | ICD-10-CM

## 2012-05-28 LAB — BASIC METABOLIC PANEL
CO2: 30 mEq/L (ref 19–32)
Chloride: 105 mEq/L (ref 96–112)
Creatinine, Ser: 1.2 mg/dL (ref 0.4–1.5)
Potassium: 4.5 mEq/L (ref 3.5–5.1)
Sodium: 141 mEq/L (ref 135–145)

## 2012-05-28 LAB — HEPATIC FUNCTION PANEL
ALT: 14 U/L (ref 0–53)
AST: 19 U/L (ref 0–37)
Bilirubin, Direct: 0.1 mg/dL (ref 0.0–0.3)
Total Bilirubin: 0.7 mg/dL (ref 0.3–1.2)
Total Protein: 6.9 g/dL (ref 6.0–8.3)

## 2012-05-28 LAB — LIPID PANEL
LDL Cholesterol: 108 mg/dL — ABNORMAL HIGH (ref 0–99)
Total CHOL/HDL Ratio: 4
Triglycerides: 115 mg/dL (ref 0.0–149.0)

## 2012-05-28 NOTE — Assessment & Plan Note (Signed)
The patient is not having any symptoms from his mild aortic stenosis. 

## 2012-05-28 NOTE — Progress Notes (Signed)
Frank Gates. Date of Birth:  11/12/1925 [facility] [address] [place], [place]  [address] [phone]         Fax   [phone]  History of Present Illness: This pleasant 78 year old gentleman is seen for a scheduled 6 month followup office visit.  He has some dementia.  His son lives with him now and brought him to the appointment today.  The patient has a past history of CABG in 2001.  He has had a history of essential hypertension.  He also has a history of mild aortic stenosis and a history of a chronically abnormal chest x-ray with pleural plaques.  Current Outpatient Prescriptions  Medication Sig Dispense Refill  . aspirin 81 MG tablet Take 81 mg by mouth daily.        Marland Kitchen ezetimibe-simvastatin (VYTORIN) 10-20 MG per tablet Take 1 tablet by mouth at bedtime.  30 tablet  11  . lisinopril (PRINIVIL,ZESTRIL) 10 MG tablet Take 1 tablet (10 mg total) by mouth daily.  30 tablet  11  . metoprolol tartrate (LOPRESSOR) 25 MG tablet Take 25 mg by mouth 2 (two) times daily.        . Multiple Vitamin (MULTIVITAMIN) tablet Take 1 tablet by mouth daily.          No Known Allergies  Patient Active Problem List  Diagnosis  . CAD (coronary artery disease)  . HTN (hypertension)  . Memory deficit  . Abnormal CXR  . Aortic stenosis    History  Smoking status  . Never Smoker   Smokeless tobacco  . Not on file    History  Alcohol Use No    Family History  Problem Relation Age of Onset  . Stroke Mother   . Heart failure Father     Review of Systems: Constitutional: no fever chills diaphoresis or fatigue or change in weight.  Head and neck: no hearing loss, no epistaxis, no photophobia or visual disturbance. Respiratory: No cough, shortness of breath or wheezing. Cardiovascular: No chest pain peripheral edema, palpitations. Gastrointestinal: No abdominal distention, no abdominal pain, no change in bowel habits hematochezia or melena. Genitourinary:  No dysuria, no frequency, no urgency, no nocturia. Musculoskeletal:No arthralgias, no back pain, no gait disturbance or myalgias. Neurological: No dizziness, no headaches, no numbness, no seizures, no syncope, no weakness, no tremors. Hematologic: No lymphadenopathy, no easy bruising. Psychiatric: No confusion, no hallucinations, no sleep disturbance.    Physical Exam: Filed Vitals:   05/28/12 0919  BP: 118/80  Pulse: 81   the general appearance reveals an elderly gentleman in no acute distress.  He has evidence of decreased recent memory.The head and neck exam reveals pupils equal and reactive.  Extraocular movements are full.  There is no scleral icterus.  The mouth and pharynx are normal.  The neck is supple.  The carotids reveal no bruits.  The jugular venous pressure is normal.  The  thyroid is not enlarged.  There is no lymphadenopathy.  The chest is clear to percussion and auscultation.  There are no rales or rhonchi.  Expansion of the chest is symmetrical.  The precordium is quiet.  The first heart sound is normal.  The second heart sound is physiologically split.  There is no  gallop rub or click.  There is a grade 2 systolic ejection murmur at the base.  No diastolic murmur.  There is no abnormal lift or heave.  The abdomen is soft and nontender.  The bowel sounds  are normal.  The liver and spleen are not enlarged.  There are no abdominal masses.  There are no abdominal bruits.  Extremities reveal good pedal pulses.  There is no phlebitis or edema.  There is no cyanosis or clubbing.  Strength is normal and symmetrical in all extremities.  There is no lateralizing weakness.  There are no sensory deficits.  The skin is warm and dry.  There is no rash.   EKG today shows normal sinus rhythm with occasional PVCs and PACs  Assessment / Plan: Continue on same medication.  Recheck in 6 months for followup office visit lipid panel hepatic function panel and basal metabolic panel.

## 2012-05-28 NOTE — Assessment & Plan Note (Signed)
Blood pressure has been remaining stable on current therapy. 

## 2012-05-28 NOTE — Progress Notes (Signed)
Quick Note:  Please report to patient. The recent labs are stable. Continue same medication and careful diet. The cholesterol is not as good as last time. The sure to take the Vytorin every day and continue on a prudent heart healthy diet ______

## 2012-05-28 NOTE — Assessment & Plan Note (Signed)
The patient stays physically active.  He has had no recurrent angina pectoris.  He enjoys working in the garden.  He did not do any downhill skiing last winter

## 2012-05-28 NOTE — Patient Instructions (Addendum)
Your physician wants you to follow-up in: 6 months.  You will receive a reminder letter in the mail two months in advance. If you don't receive a letter, please call our office to schedule the follow-up appointment.  Your physician recommends that you return for lab work in: today (already ordered) and in 6 months (lipid, liver, bmet)

## 2012-06-04 ENCOUNTER — Telehealth: Payer: Self-pay | Admitting: *Deleted

## 2012-06-04 NOTE — Telephone Encounter (Signed)
Message copied by Burnell Blanks on Thu Jun 04, 2012  4:07 PM ------      Message from: Cassell Clement      Created: Thu May 28, 2012  3:30 PM       Please report to patient.  The recent labs are stable. Continue same medication and careful diet.  The cholesterol is not as good as last time.  The sure to take the Vytorin every day and continue on a prudent heart healthy diet

## 2012-06-04 NOTE — Telephone Encounter (Signed)
Unable to leave message (mailbox full) mailed copy of labs and highlighted  Dr. Yevonne Pax comments

## 2012-06-08 ENCOUNTER — Other Ambulatory Visit: Payer: Self-pay | Admitting: Cardiology

## 2012-06-08 NOTE — Telephone Encounter (Signed)
Refilled vytorin

## 2012-06-18 ENCOUNTER — Telehealth: Payer: Self-pay | Admitting: Cardiology

## 2012-06-18 NOTE — Telephone Encounter (Signed)
error 

## 2012-06-22 DIAGNOSIS — H2510 Age-related nuclear cataract, unspecified eye: Secondary | ICD-10-CM | POA: Diagnosis not present

## 2012-06-22 DIAGNOSIS — H01009 Unspecified blepharitis unspecified eye, unspecified eyelid: Secondary | ICD-10-CM | POA: Diagnosis not present

## 2012-06-22 DIAGNOSIS — H5200 Hypermetropia, unspecified eye: Secondary | ICD-10-CM | POA: Diagnosis not present

## 2012-06-22 DIAGNOSIS — H25019 Cortical age-related cataract, unspecified eye: Secondary | ICD-10-CM | POA: Diagnosis not present

## 2012-07-01 ENCOUNTER — Other Ambulatory Visit: Payer: Self-pay | Admitting: Cardiology

## 2012-07-02 MED ORDER — EZETIMIBE-SIMVASTATIN 10-20 MG PO TABS: 1.0000 | ORAL_TABLET | Freq: Every day | ORAL | Status: DC

## 2012-07-02 NOTE — Telephone Encounter (Signed)
New Problem:    Patient's sone called in needing a refill of ezetimibe-simvastatin (VYTORIN) 10-20 MG per tablet.  Please call back.

## 2012-07-02 NOTE — Telephone Encounter (Signed)
Refill done for vytorin pre cert done also

## 2012-07-06 ENCOUNTER — Encounter: Payer: Self-pay | Admitting: Cardiology

## 2012-07-22 DIAGNOSIS — H25019 Cortical age-related cataract, unspecified eye: Secondary | ICD-10-CM | POA: Diagnosis not present

## 2012-07-22 DIAGNOSIS — H2589 Other age-related cataract: Secondary | ICD-10-CM | POA: Diagnosis not present

## 2012-07-22 DIAGNOSIS — H2510 Age-related nuclear cataract, unspecified eye: Secondary | ICD-10-CM | POA: Diagnosis not present

## 2012-08-17 ENCOUNTER — Other Ambulatory Visit: Payer: Self-pay | Admitting: Cardiology

## 2012-08-26 DIAGNOSIS — Z23 Encounter for immunization: Secondary | ICD-10-CM | POA: Diagnosis not present

## 2012-10-12 ENCOUNTER — Telehealth: Payer: Self-pay | Admitting: Cardiology

## 2012-10-12 MED ORDER — LISINOPRIL 10 MG PO TABS: 10.0000 mg | ORAL_TABLET | Freq: Every day | ORAL | Status: DC

## 2012-10-12 NOTE — Telephone Encounter (Signed)
plz return call to pt [phone] regarding medication refill.  Pt is not sure which medication needs to be refilled.

## 2012-10-12 NOTE — Telephone Encounter (Signed)
Spoke with patient and refilled medications.

## 2012-10-28 DIAGNOSIS — H25019 Cortical age-related cataract, unspecified eye: Secondary | ICD-10-CM | POA: Diagnosis not present

## 2012-10-28 DIAGNOSIS — H2589 Other age-related cataract: Secondary | ICD-10-CM | POA: Diagnosis not present

## 2012-10-28 DIAGNOSIS — H2510 Age-related nuclear cataract, unspecified eye: Secondary | ICD-10-CM | POA: Diagnosis not present

## 2012-11-17 ENCOUNTER — Ambulatory Visit: Payer: Medicare Other | Admitting: Cardiology

## 2012-12-14 ENCOUNTER — Encounter: Payer: Self-pay | Admitting: Cardiology

## 2012-12-14 ENCOUNTER — Ambulatory Visit (INDEPENDENT_AMBULATORY_CARE_PROVIDER_SITE_OTHER): Payer: Medicare Other | Admitting: Cardiology

## 2012-12-14 VITALS — BP 130/76 | HR 76 | Ht 72.0 in | Wt 181.0 lb

## 2012-12-14 DIAGNOSIS — I10 Essential (primary) hypertension: Secondary | ICD-10-CM

## 2012-12-14 DIAGNOSIS — R413 Other amnesia: Secondary | ICD-10-CM

## 2012-12-14 DIAGNOSIS — I2510 Atherosclerotic heart disease of native coronary artery without angina pectoris: Secondary | ICD-10-CM

## 2012-12-14 DIAGNOSIS — E7800 Pure hypercholesterolemia, unspecified: Secondary | ICD-10-CM

## 2012-12-14 NOTE — Assessment & Plan Note (Signed)
The patient has a history of ischemic heart disease.  He had bypass surgery 13 years ago.  He is not having any recurrent chest pain or angina.  He is not having any difficulty with the exercises in the silver sneakers program.  He also continues to do yard work during the warm  months of the year.

## 2012-12-14 NOTE — Assessment & Plan Note (Signed)
The patient's memory deficit does not appear to have been any worse over the past 6 months. The family had a geriatric social worker come out of the house and she made some helpful suggestions.

## 2012-12-14 NOTE — Patient Instructions (Addendum)
Your physician wants you to follow-up in: 6 months  You will receive a reminder letter in the mail two months in advance. If you don't receive a letter, please call our office to schedule the follow-up appointment.   Your physician recommends that you return for a FASTING lipid profile: 6 months lp, hfp, bmet  Your physician recommends that you continue on your current medications as directed. Please refer to the Current Medication list given to you today.

## 2012-12-14 NOTE — Assessment & Plan Note (Signed)
The patient denies any headaches dizziness or syncope.  No symptoms of CHF

## 2012-12-14 NOTE — Progress Notes (Signed)
Frank Gates. Date of Birth:  1925-11-05 [facility] [address] [place], [place]  [address] [phone]         Fax   [phone]  History of Present Illness: This pleasant 79 year old gentleman is seen for a scheduled 6 month followup office visit. He has some dementia. His daughter-in-law lives with him now and helps with meal preparation.  The patient is still able to drive the car and he goes to silver sneakers twice a week at the [employer]. The patient has a past history of CABG in 2001. He has had a history of essential hypertension. He also has a history of mild aortic stenosis and a history of a chronically abnormal chest x-ray with pleural plaques.    Current Outpatient Prescriptions  Medication Sig Dispense Refill  . aspirin 81 MG tablet Take 81 mg by mouth daily.        Marland Kitchen ezetimibe-simvastatin (VYTORIN) 10-20 MG per tablet Take 1 tablet by mouth daily.  30 tablet  11  . lisinopril (PRINIVIL,ZESTRIL) 10 MG tablet Take 1 tablet (10 mg total) by mouth daily.  30 tablet  11  . metoprolol (LOPRESSOR) 50 MG tablet TAKE (1/2) TABLET TWICE DAILY.  90 tablet  PRN  . Multiple Vitamin (MULTIVITAMIN) tablet Take 1 tablet by mouth daily.         No current facility-administered medications for this visit.    No Known Allergies  Patient Active Problem List  Diagnosis  . CAD (coronary artery disease)  . HTN (hypertension)  . Memory deficit  . Abnormal CXR  . Aortic stenosis    History  Smoking status  . Never Smoker   Smokeless tobacco  . Not on file    History  Alcohol Use No    Family History  Problem Relation Age of Onset  . Stroke Mother   . Heart failure Father     Review of Systems: Constitutional: no fever chills diaphoresis or fatigue or change in weight.  Head and neck: no hearing loss, no epistaxis, no photophobia or visual disturbance. Respiratory: No cough, shortness of breath or wheezing. Cardiovascular: No chest pain  peripheral edema, palpitations. Gastrointestinal: No abdominal distention, no abdominal pain, no change in bowel habits hematochezia or melena. Genitourinary: No dysuria, no frequency, no urgency, no nocturia. Musculoskeletal:No arthralgias, no back pain, no gait disturbance or myalgias. Neurological: No dizziness, no headaches, no numbness, no seizures, no syncope, no weakness, no tremors. Hematologic: No lymphadenopathy, no easy bruising. Psychiatric: No confusion, no hallucinations, no sleep disturbance.    Physical Exam: Filed Vitals:   12/14/12 1024  BP: 130/76  Pulse: 76   the general appearance reveals a well-developed well-nourished gentleman in no distress.The head and neck exam reveals pupils equal and reactive.  Extraocular movements are full.  There is no scleral icterus.  The mouth and pharynx are normal.  The neck is supple.  The carotids reveal no bruits.  The jugular venous pressure is normal.  The  thyroid is not enlarged.  There is no lymphadenopathy.  The chest is clear to percussion and auscultation.  There are no rales or rhonchi.  Expansion of the chest is symmetrical.  The precordium is quiet.  The first heart sound is normal.  The second heart sound is physiologically split.  There is no murmur gallop rub or click.  There is no abnormal lift or heave.  The abdomen is soft and nontender.  The bowel sounds are normal.  The  liver and spleen are not enlarged.  There are no abdominal masses.  There are no abdominal bruits.  Extremities reveal good pedal pulses.  There is no phlebitis or edema.  There is no cyanosis or clubbing.  Strength is normal and symmetrical in all extremities.  There is no lateralizing weakness.  There are no sensory deficits.  The skin is warm and dry.  There is no rash.    Assessment / Plan: Continue same medication.  Recheck in 6 months for followup office visit lipid panel hepatic function panel and basal metabolic panel

## 2013-06-11 ENCOUNTER — Ambulatory Visit (INDEPENDENT_AMBULATORY_CARE_PROVIDER_SITE_OTHER): Payer: Medicare Other | Admitting: Cardiology

## 2013-06-11 DIAGNOSIS — I2510 Atherosclerotic heart disease of native coronary artery without angina pectoris: Secondary | ICD-10-CM

## 2013-06-11 DIAGNOSIS — I10 Essential (primary) hypertension: Secondary | ICD-10-CM | POA: Diagnosis not present

## 2013-06-11 LAB — BASIC METABOLIC PANEL
BUN: 19 mg/dL (ref 6–23)
CO2: 29 mEq/L (ref 19–32)
Chloride: 106 mEq/L (ref 96–112)
GFR: 49.15 mL/min — ABNORMAL LOW (ref >60.00)
Glucose, Bld: 101 mg/dL — ABNORMAL HIGH (ref 70–99)
Potassium: 4.3 mEq/L (ref 3.5–5.1)
Sodium: 140 mEq/L (ref 135–145)

## 2013-06-11 LAB — HEPATIC FUNCTION PANEL
ALT: 13 U/L (ref 0–53)
Bilirubin, Direct: 0 mg/dL (ref 0.0–0.3)
Total Bilirubin: 0.8 mg/dL (ref 0.3–1.2)

## 2013-06-11 LAB — LIPID PANEL
HDL: 42 mg/dL (ref >39.00)
LDL Cholesterol: 117 mg/dL — ABNORMAL HIGH (ref 0–99)
VLDL: 18.8 mg/dL (ref 0.0–40.0)

## 2013-06-14 NOTE — Progress Notes (Signed)
Quick Note:  Please make copy of labs for patient visit. ______ 

## 2013-06-21 ENCOUNTER — Ambulatory Visit (INDEPENDENT_AMBULATORY_CARE_PROVIDER_SITE_OTHER): Payer: Medicare Other | Admitting: Cardiology

## 2013-06-21 ENCOUNTER — Encounter: Payer: Self-pay | Admitting: Cardiology

## 2013-06-21 VITALS — BP 118/82 | HR 70 | Ht 72.0 in | Wt 176.0 lb

## 2013-06-21 DIAGNOSIS — I2510 Atherosclerotic heart disease of native coronary artery without angina pectoris: Secondary | ICD-10-CM | POA: Diagnosis not present

## 2013-06-21 DIAGNOSIS — E7800 Pure hypercholesterolemia, unspecified: Secondary | ICD-10-CM

## 2013-06-21 DIAGNOSIS — I10 Essential (primary) hypertension: Secondary | ICD-10-CM | POA: Diagnosis not present

## 2013-06-21 NOTE — Progress Notes (Signed)
Frank Gates. Date of Birth:  [birth year] [facility] [address] [place], [place]  [address] [phone]         Fax   [phone]  History of Present Illness: This pleasant 79 year old gentleman is seen for a scheduled 6 month followup office visit. He has some dementia. His son and his daughter-in-law lives with him now and helps with meal preparation.  The [facility] participating in a walking program daily.  He also does gardening.  In the wintertime he still does downhill skiing. The patient has a past history of CABG in 2001. He has had a history of essential hypertension. He also has a history of mild aortic stenosis and a history of a chronically abnormal chest x-ray with pleural plaques.    Current Outpatient Prescriptions  Medication Sig Dispense Refill  . aspirin 81 MG tablet Take 81 mg by mouth daily.        Marland Kitchen ezetimibe-simvastatin (VYTORIN) 10-20 MG per tablet Take 1 tablet by mouth daily.  30 tablet  11  . lisinopril (PRINIVIL,ZESTRIL) 10 MG tablet Take 1 tablet (10 mg total) by mouth daily.  30 tablet  11  . metoprolol (LOPRESSOR) 50 MG tablet TAKE (1/2) TABLET TWICE DAILY.  90 tablet  PRN  . Multiple Vitamin (MULTIVITAMIN) tablet Take 1 tablet by mouth daily.         No current facility-administered medications for this visit.    No Known Allergies  Patient Active Problem List   Diagnosis Date Noted  . CAD (coronary artery disease) 05/29/2011  . HTN (hypertension) 05/29/2011  . Memory deficit 05/29/2011  . Abnormal CXR 05/29/2011  . Aortic stenosis 05/29/2011    History  Smoking status  . Never Smoker   Smokeless tobacco  . Not on file    History  Alcohol Use No    Family History  Problem Relation Age of Onset  . Stroke Mother   . Heart failure Father     Review of Systems: Constitutional: no fever chills diaphoresis or fatigue or change in weight.  Head and neck: no hearing  loss, no epistaxis, no photophobia or visual disturbance. Respiratory: No cough, shortness of breath or wheezing. Cardiovascular: No chest pain peripheral edema, palpitations. Gastrointestinal: No abdominal distention, no abdominal pain, no change in bowel habits hematochezia or melena. Genitourinary: No dysuria, no frequency, no urgency, no nocturia. Musculoskeletal:No arthralgias, no back pain, no gait disturbance or myalgias. Neurological: No dizziness, no headaches, no numbness, no seizures, no syncope, no weakness, no tremors. Hematologic: No lymphadenopathy, no easy bruising. Psychiatric: No confusion, no hallucinations, no sleep disturbance.    Physical Exam: Filed Vitals:   06/21/13 1037  BP: 118/82  Pulse: 70   general appearance reveals a pleasant elderly gentleman in no distress.The head and neck exam reveals pupils equal and reactive.  Extraocular movements are full.  There is no scleral icterus.  The mouth and pharynx are normal.  The neck is supple.  The carotids reveal no bruits.  The jugular venous pressure is normal.  The  thyroid is not enlarged.  There is no lymphadenopathy.  The chest is clear to percussion and auscultation.  There are no rales or rhonchi.  Expansion of the chest is symmetrical.  The precordium is quiet.  The first heart sound is normal.  The second heart sound is physiologically split.  There is no murmur gallop rub or click.  There is no  abnormal lift or heave.  The abdomen is soft and nontender.  The bowel sounds are normal.  The liver and spleen are not enlarged.  There are no abdominal masses.  There are no abdominal bruits.  Extremities reveal good pedal pulses.  There is no phlebitis or edema.  There is no cyanosis or clubbing.  Strength is normal and symmetrical in all extremities.  There is no lateralizing weakness.  There are no sensory deficits.  The skin is warm and dry.  There is no rash.     Assessment / Plan: Continue same medication.   Recheck in 6 months for office visit EKG lipid panel hepatic function panel and basal metabolic panel.

## 2013-06-21 NOTE — Patient Instructions (Signed)
Your physician recommends that you continue on your current medications as directed. Please refer to the Current Medication list given to you today.  Your physician wants you to follow-up in: 6 months with fasting labs (lp/bmet/hfp) AND EKG  You will receive a reminder letter in the mail two months in advance. If you don't receive a letter, please call our office to schedule the follow-up appointment.  

## 2013-06-21 NOTE — Assessment & Plan Note (Signed)
No recurrent angina pectoris.  Exercise tolerance is good.

## 2013-06-21 NOTE — Assessment & Plan Note (Signed)
The patient has a history of hypercholesterolemia and is on Vytorin.  His son indicates that the patient takes his medicines intermittently.  I encouraged him to take his Vytorin more often because his LDL is starting to rise he does have a history of known coronary disease.

## 2013-06-21 NOTE — Assessment & Plan Note (Signed)
The patient is not having any headaches or dizziness.  No symptoms of CHF.

## 2014-04-21 ENCOUNTER — Other Ambulatory Visit (INDEPENDENT_AMBULATORY_CARE_PROVIDER_SITE_OTHER): Payer: Medicare Other

## 2014-04-21 DIAGNOSIS — E7800 Pure hypercholesterolemia, unspecified: Secondary | ICD-10-CM

## 2014-04-21 LAB — LIPID PANEL
Cholesterol: 171 mg/dL (ref 0–200)
HDL: 47.6 mg/dL (ref >39.00)
LDL Cholesterol: 108 mg/dL — ABNORMAL HIGH (ref 0–99)
NONHDL: 123.4
Total CHOL/HDL Ratio: 4
Triglycerides: 76 mg/dL (ref 0.0–149.0)
VLDL: 15.2 mg/dL (ref 0.0–40.0)

## 2014-04-21 LAB — BASIC METABOLIC PANEL
BUN: 19 mg/dL (ref 6–23)
CALCIUM: 9.5 mg/dL (ref 8.4–10.5)
CO2: 26 meq/L (ref 19–32)
Chloride: 107 mEq/L (ref 96–112)
Creatinine, Ser: 1.2 mg/dL (ref 0.4–1.5)
GFR: 59.87 mL/min — ABNORMAL LOW (ref >60.00)
GLUCOSE: 111 mg/dL — AB (ref 70–99)
Potassium: 4.2 mEq/L (ref 3.5–5.1)
SODIUM: 140 meq/L (ref 135–145)

## 2014-04-21 LAB — HEPATIC FUNCTION PANEL
ALT: 12 U/L (ref 0–53)
AST: 19 U/L (ref 0–37)
Albumin: 4.1 g/dL (ref 3.5–5.2)
Alkaline Phosphatase: 68 U/L (ref 39–117)
BILIRUBIN TOTAL: 0.7 mg/dL (ref 0.2–1.2)
Bilirubin, Direct: 0.1 mg/dL (ref 0.0–0.3)
Total Protein: 6.8 g/dL (ref 6.0–8.3)

## 2014-04-21 NOTE — Progress Notes (Signed)
Quick Note:  Please make copy of labs for patient visit. ______ 

## 2014-04-26 ENCOUNTER — Ambulatory Visit (INDEPENDENT_AMBULATORY_CARE_PROVIDER_SITE_OTHER): Payer: Medicare Other | Admitting: Cardiology

## 2014-04-26 ENCOUNTER — Encounter: Payer: Self-pay | Admitting: Cardiology

## 2014-04-26 VITALS — BP 127/79 | HR 87 | Ht 72.0 in | Wt 175.0 lb

## 2014-04-26 DIAGNOSIS — I119 Hypertensive heart disease without heart failure: Secondary | ICD-10-CM

## 2014-04-26 DIAGNOSIS — I4949 Other premature depolarization: Secondary | ICD-10-CM | POA: Diagnosis not present

## 2014-04-26 DIAGNOSIS — I359 Nonrheumatic aortic valve disorder, unspecified: Secondary | ICD-10-CM

## 2014-04-26 DIAGNOSIS — Z951 Presence of aortocoronary bypass graft: Secondary | ICD-10-CM | POA: Diagnosis not present

## 2014-04-26 DIAGNOSIS — E7800 Pure hypercholesterolemia, unspecified: Secondary | ICD-10-CM

## 2014-04-26 DIAGNOSIS — I493 Ventricular premature depolarization: Secondary | ICD-10-CM | POA: Insufficient documentation

## 2014-04-26 DIAGNOSIS — I350 Nonrheumatic aortic (valve) stenosis: Secondary | ICD-10-CM

## 2014-04-26 NOTE — Progress Notes (Signed)
Frank HaHarry R Fetsch Jr. Date of Birth:  Jan 18, 1926 [facility] [address] [place], [place]  [address] [phone]        Fax   [phone]   History of Present Illness: This pleasant 80 year old gentleman is seen for a scheduled 6 month followup office visit. He has some dementia. His son and his daughter-in-law lives with him now and helps with meal preparation. The patient gets [facility] in participating in a walking program daily. He also does gardening.The patient has a past history of CABG in 2001. He has had a history of essential hypertension. He also has a history of mild aortic stenosis.  He had an echocardiogram in July 2008 showing very mild aortic stenosis, ejection fraction 55-60%, and diastolic dysfunction.  He has a history of a chronically abnormal chest x-ray with pleural plaques.  Since last visit he has been feeling well.  His son points out that he has not had any intercurrent illnesses for the past 3 years or so.  His son attributes that to his very healthy diet and regular exercise lifestyle.   Current Outpatient Prescriptions  Medication Sig Dispense Refill  . aspirin 81 MG tablet Take 81 mg by mouth daily.        Marland Kitchen. ezetimibe-simvastatin (VYTORIN) 10-20 MG per tablet Take 1 tablet by mouth daily.  30 tablet  11  . lisinopril (PRINIVIL,ZESTRIL) 10 MG tablet Take 1 tablet (10 mg total) by mouth daily.  30 tablet  11  . metoprolol (LOPRESSOR) 50 MG tablet TAKE (1/2) TABLET TWICE DAILY.  90 tablet  PRN  . Multiple Vitamin (MULTIVITAMIN) tablet Take 1 tablet by mouth daily.         No current facility-administered medications for this visit.    No Known Allergies  Patient Active Problem List   Diagnosis Date Noted  . PVC's (premature ventricular contractions) 04/26/2014  . Hypercholesterolemia 06/21/2013  . CAD (coronary artery disease) 05/29/2011  . HTN (hypertension) 05/29/2011  . Memory deficit 05/29/2011  .  Abnormal CXR 05/29/2011  . Aortic stenosis 05/29/2011    History  Smoking status  . Never Smoker   Smokeless tobacco  . Not on file    History  Alcohol Use No    Family History  Problem Relation Age of Onset  . Stroke Mother   . Heart failure Father     Review of Systems: Constitutional: no fever chills diaphoresis or fatigue or change in weight.  Head and neck: no hearing loss, no epistaxis, no photophobia or visual disturbance. Respiratory: No cough, shortness of breath or wheezing. Cardiovascular: No chest pain peripheral edema, palpitations. Gastrointestinal: No abdominal distention, no abdominal pain, no change in bowel habits hematochezia or melena. Genitourinary: No dysuria, no frequency, no urgency, no nocturia. Musculoskeletal:No arthralgias, no back pain, no gait disturbance or myalgias. Neurological: No dizziness, no headaches, no numbness, no seizures, no syncope, no weakness, no tremors. Hematologic: No lymphadenopathy, no easy bruising. Psychiatric: No confusion, no hallucinations, no sleep disturbance.    Physical Exam: Filed Vitals:   04/26/14 1629  BP: 127/79  Pulse: 87   EKG shows normal sinus with frequent PVCs.  Is 12/14/12, PVCs are new  Assessment / Plan: 1. ischemic heart disease status post CABG in 2001. 2. asymptomatic PVC 3. Hypercholesterolemia 4. memory disorder 5. chronically abnormal chest x-ray with pleural plaques  Plan: Continue same medication.  Decrease caffeine in diet. Recheck in 6 months for office  visit lipid panel hepatic function panel and basal metabolic panel.  Consider update his chest x-ray after next office visit.

## 2014-04-26 NOTE — Assessment & Plan Note (Signed)
Patient has a history of hypercholesterolemia.  His lipids are satisfactory on current dose of Vytorin.  No side effects or myalgias

## 2014-04-26 NOTE — Patient Instructions (Signed)
DECREASE YOUR CAFFEINE (TEA) INTAKE  Your physician recommends that you continue on your current medications as directed. Please refer to the Current Medication list given to you today.  Your physician wants you to follow-up in: 6 months with fasting labs (lp/bmet/hfp) You will receive a reminder letter in the mail two months in advance. If you don't receive a letter, please call our office to schedule the follow-up appointment.

## 2014-04-26 NOTE — Assessment & Plan Note (Signed)
The patient is experiencing PVCs.  He is not aware of them himself.  His son states that the patient drinks a lot of black tea which could be causing this.  We will cut down on his caffeine in his diet.

## 2014-04-26 NOTE — Assessment & Plan Note (Signed)
The patient is not having any symptoms of angina pectoris or shortness of breath, dizziness or syncope

## 2015-08-28 ENCOUNTER — Other Ambulatory Visit (INDEPENDENT_AMBULATORY_CARE_PROVIDER_SITE_OTHER): Payer: Medicare Other | Admitting: *Deleted

## 2015-08-28 DIAGNOSIS — I10 Essential (primary) hypertension: Secondary | ICD-10-CM

## 2015-08-28 LAB — LIPID PANEL
Cholesterol: 147 mg/dL (ref 125–200)
HDL: 40 mg/dL (ref >=40)
LDL CALC: 94 mg/dL (ref <130)
Total CHOL/HDL Ratio: 3.7 Ratio (ref <=5.0)
Triglycerides: 65 mg/dL (ref <150)
VLDL: 13 mg/dL (ref <30)

## 2015-08-28 LAB — HEPATIC FUNCTION PANEL
ALBUMIN: 4 g/dL (ref 3.6–5.1)
ALK PHOS: 76 U/L (ref 40–115)
ALT: 11 U/L (ref 9–46)
AST: 15 U/L (ref 10–35)
BILIRUBIN DIRECT: 0.1 mg/dL (ref <=0.2)
BILIRUBIN TOTAL: 0.6 mg/dL (ref 0.2–1.2)
Indirect Bilirubin: 0.5 mg/dL (ref 0.2–1.2)
Total Protein: 6.6 g/dL (ref 6.1–8.1)

## 2015-08-28 LAB — BASIC METABOLIC PANEL
BUN: 27 mg/dL — ABNORMAL HIGH (ref 7–25)
CHLORIDE: 107 mmol/L (ref 98–110)
CO2: 25 mmol/L (ref 20–31)
Calcium: 9.4 mg/dL (ref 8.6–10.3)
Creat: 1.21 mg/dL — ABNORMAL HIGH (ref 0.70–1.11)
Glucose, Bld: 93 mg/dL (ref 65–99)
Potassium: 4.7 mmol/L (ref 3.5–5.3)
Sodium: 143 mmol/L (ref 135–146)

## 2015-08-29 ENCOUNTER — Ambulatory Visit: Payer: Medicare Other | Admitting: Cardiology

## 2015-08-29 NOTE — Progress Notes (Signed)
Quick Note:  Please make copy of labs for patient visit. ______ 

## 2015-08-30 ENCOUNTER — Encounter: Payer: Self-pay | Admitting: Cardiology

## 2015-08-30 ENCOUNTER — Ambulatory Visit (INDEPENDENT_AMBULATORY_CARE_PROVIDER_SITE_OTHER): Payer: Medicare Other | Admitting: Cardiology

## 2015-08-30 VITALS — BP 116/70 | HR 73 | Ht 72.0 in | Wt 181.8 lb

## 2015-08-30 DIAGNOSIS — I350 Nonrheumatic aortic (valve) stenosis: Secondary | ICD-10-CM | POA: Diagnosis not present

## 2015-08-30 DIAGNOSIS — Z951 Presence of aortocoronary bypass graft: Secondary | ICD-10-CM | POA: Diagnosis not present

## 2015-08-30 DIAGNOSIS — I119 Hypertensive heart disease without heart failure: Secondary | ICD-10-CM | POA: Diagnosis not present

## 2015-08-30 NOTE — Progress Notes (Signed)
Cardiology Office Note   Date:  08/30/2015   ID:  Frank Ha., DOB [birth year], MRN [id number]  PCP:  No PCP Per Patient  Cardiologist: Cassell Clement MD  Chief Complaint  Patient presents with  . Coronary Artery Disease      History of Present Illness: Frank Gates. is a 81 y.o. male who presents for a six-month follow-up visit  . He has some dementia. His son and his daughter-in-law lives with him now and helps with meal preparation. The patient gets regular [facility] in participating in a walking program daily.  He is a member of the [employer].  He enjoys walking [place] to the [place] in [place]. He also does gardening.The patient has a past history of CABG in 2001. He has had a history of essential hypertension. He also has a history of mild aortic stenosis. He had an echocardiogram in July 2008 showing very mild aortic stenosis, ejection fraction 55-60%, and diastolic dysfunction. He has a history of a chronically abnormal chest x-ray with pleural plaques. Since last visit he has been feeling well.He denies any cough or sputum production.  He is not having any chest pain or shortness of breath.  Past Medical History  Diagnosis Date  . Hypertension   . Hyperlipidemia   . Memory loss   . IHD (ischemic heart disease)     s/p CABG in 2001  . Asymptomatic PVCs   . PAC (premature atrial contraction)   . LV dysfunction     EF 50% per nuclear in 2008  . Aortic stenosis     Mild to moderate per echo in 2008    Past Surgical History  Procedure Laterality Date  . Cardiac catheterization  04/10/2000    EF 44-45%  . Tonsillectomy and adenoidectomy    . Coronary artery bypass graft  2001  . US echocardiography  04/29/2007    EF 55-60%  . Cardiovascular stress test  08/24/2007    EF 50%     Current Outpatient Prescriptions  Medication Sig Dispense Refill  . aspirin 81 MG tablet Take 81 mg by mouth daily.      Marland Kitchen ezetimibe-simvastatin  (VYTORIN) 10-20 MG per tablet Take 1 tablet by mouth daily. 30 tablet 11  . metoprolol (LOPRESSOR) 50 MG tablet Take 25 mg by mouth 2 (two) times daily.    . Multiple Vitamin (MULTIVITAMIN) tablet Take 1 tablet by mouth daily.      Marland Kitchen lisinopril (PRINIVIL,ZESTRIL) 10 MG tablet Take 1 tablet (10 mg total) by mouth daily. 30 tablet 11   No current facility-administered medications for this visit.    Allergies:   Review of patient's allergies indicates no known allergies.    Social History:  The patient  reports that he has never smoked. He does not have any smokeless tobacco history on file. He reports that he does not drink alcohol or use illicit drugs.   Family History:  The patient's family history includes Heart failure in his father; Stroke in his mother.    ROS:  Please see the history of present illness.   Otherwise, review of systems are positive for none.   All other systems are reviewed and negative.    PHYSICAL EXAM: VS:  BP 116/70 mmHg  Pulse 73  Ht 6' (1.829 m)  Wt 181 lb 12.8 oz (82.464 kg)  BMI 24.65 kg/m2 , BMI Body mass index is 24.65 kg/(m^2). GEN: Well nourished, well  developed, in no acute distress HEENT: normal Neck: no JVD, carotid bruits, or masses Cardiac: RRR; there is a soft systolic ejection murmur at the aortic area.  No rubs, or gallops,no edema  Respiratory:  clear to auscultation bilaterally, normal work of breathing GI: soft, nontender, nondistended, + BS MS: no deformity or atrophy Skin: warm and dry, no rash Neuro:  Strength and sensation are intact Psych: euthymic mood, full affect   EKG:  EKG is ordered today. The ekg ordered today demonstrates normal sinus rhythm with occasional PACs.  No ischemic changes.   Recent Labs: 08/28/2015: ALT 11; BUN 27*; Creat 1.21*; Potassium 4.7; Sodium 143    Lipid Panel    Component Value Date/Time   CHOL 147 08/28/2015 0859   TRIG 65 08/28/2015 0859   HDL 40 08/28/2015 0859   CHOLHDL 3.7 08/28/2015  0859   VLDL 13 08/28/2015 0859   LDLCALC 94 08/28/2015 0859      Wt Readings from Last 3 Encounters:  08/30/15 181 lb 12.8 oz (82.464 kg)  04/26/14 175 lb (79.379 kg)  06/21/13 176 lb (79.833 kg)         ASSESSMENT AND PLAN:  1. ischemic heart disease status post CABG in 2001. 2. asymptomatic PACs 3. Hypercholesterolemia 4. memory disorder 5. chronically abnormal chest x-ray with pleural plaques  Plan: Continue same medication.  Recheck in 6 months for office visit lipid panel hepatic function panel and basal metabolic panel.   Current medicines are reviewed at length with the patient today.  The patient does not have concerns regarding medicines.  The following changes have been made:  no change  Labs/ tests ordered today include:  No orders of the defined types were placed in this encounter.     Disposition:   Disposition: Continue on current medication.  Recheck in 6 months for follow-up office visit with Frank Gates or Frank Gates.  Get follow-up lipid panel hepatic function panel and basal metabolic panel at that time.  Karie SchwalbeSigned, Frank Abadi MD 08/30/2015 11:03 AM    [facility] [address], [place], [place]  [address] Phone: [phone]; Fax: [phone]

## 2015-08-30 NOTE — Patient Instructions (Signed)
Medication Instructions:  Your physician recommends that you continue on your current medications as directed. Please refer to the Current Medication list given to you today.  Labwork: none  Testing/Procedures: none  Follow-Up: Your physician recommends that you schedule a follow-up appointment in: 6 months with fasting labs (lp/bmet/hfp) with Dawayne PatriciaLori G NP or Bing NeighborsScott W PA   If you need a refill on your cardiac medications before your next appointment, please call your pharmacy.

## 2015-12-01 ENCOUNTER — Ambulatory Visit: Payer: Medicare Other | Admitting: Cardiology

## 2016-02-08 DIAGNOSIS — M7062 Trochanteric bursitis, left hip: Secondary | ICD-10-CM | POA: Diagnosis not present

## 2016-05-29 DIAGNOSIS — M659 Synovitis and tenosynovitis, unspecified: Secondary | ICD-10-CM | POA: Diagnosis not present

## 2016-05-29 DIAGNOSIS — M7062 Trochanteric bursitis, left hip: Secondary | ICD-10-CM | POA: Diagnosis not present

## 2016-06-10 DIAGNOSIS — M7062 Trochanteric bursitis, left hip: Secondary | ICD-10-CM | POA: Diagnosis not present

## 2016-08-26 ENCOUNTER — Telehealth: Payer: Self-pay | Admitting: Cardiology

## 2016-08-26 NOTE — Telephone Encounter (Signed)
Patient's daughter wants to be referred to a Geriatric provider and would like to be referred

## 2016-11-01 DIAGNOSIS — I10 Essential (primary) hypertension: Secondary | ICD-10-CM | POA: Diagnosis not present

## 2016-11-01 DIAGNOSIS — Z23 Encounter for immunization: Secondary | ICD-10-CM | POA: Diagnosis not present

## 2016-11-01 DIAGNOSIS — L821 Other seborrheic keratosis: Secondary | ICD-10-CM | POA: Diagnosis not present

## 2016-11-01 DIAGNOSIS — G309 Alzheimer's disease, unspecified: Secondary | ICD-10-CM | POA: Diagnosis not present

## 2016-11-01 DIAGNOSIS — F0280 Dementia in other diseases classified elsewhere without behavioral disturbance: Secondary | ICD-10-CM | POA: Diagnosis not present

## 2017-05-23 DIAGNOSIS — Z23 Encounter for immunization: Secondary | ICD-10-CM | POA: Diagnosis not present

## 2017-05-23 DIAGNOSIS — G309 Alzheimer's disease, unspecified: Secondary | ICD-10-CM | POA: Diagnosis not present

## 2017-05-23 DIAGNOSIS — I10 Essential (primary) hypertension: Secondary | ICD-10-CM | POA: Diagnosis not present

## 2017-05-23 DIAGNOSIS — B351 Tinea unguium: Secondary | ICD-10-CM | POA: Diagnosis not present

## 2017-05-23 DIAGNOSIS — F0280 Dementia in other diseases classified elsewhere without behavioral disturbance: Secondary | ICD-10-CM | POA: Diagnosis not present

## 2017-09-29 DIAGNOSIS — G309 Alzheimer's disease, unspecified: Secondary | ICD-10-CM | POA: Diagnosis not present

## 2017-09-29 DIAGNOSIS — F0280 Dementia in other diseases classified elsewhere without behavioral disturbance: Secondary | ICD-10-CM | POA: Diagnosis not present

## 2017-09-29 DIAGNOSIS — I10 Essential (primary) hypertension: Secondary | ICD-10-CM | POA: Diagnosis not present

## 2018-03-30 DIAGNOSIS — G309 Alzheimer's disease, unspecified: Secondary | ICD-10-CM | POA: Diagnosis not present

## 2018-03-30 DIAGNOSIS — I10 Essential (primary) hypertension: Secondary | ICD-10-CM | POA: Diagnosis not present

## 2018-03-30 DIAGNOSIS — F0280 Dementia in other diseases classified elsewhere without behavioral disturbance: Secondary | ICD-10-CM | POA: Diagnosis not present

## 2018-05-12 DIAGNOSIS — B351 Tinea unguium: Secondary | ICD-10-CM | POA: Diagnosis not present

## 2018-05-21 ENCOUNTER — Ambulatory Visit: Payer: Self-pay | Admitting: Podiatry

## 2018-06-01 ENCOUNTER — Encounter: Payer: Self-pay | Admitting: Podiatry

## 2018-06-01 ENCOUNTER — Ambulatory Visit (INDEPENDENT_AMBULATORY_CARE_PROVIDER_SITE_OTHER): Payer: Medicare Other | Admitting: Podiatry

## 2018-06-01 DIAGNOSIS — M79675 Pain in left toe(s): Secondary | ICD-10-CM | POA: Diagnosis not present

## 2018-06-01 DIAGNOSIS — M79674 Pain in right toe(s): Secondary | ICD-10-CM

## 2018-06-01 DIAGNOSIS — B351 Tinea unguium: Secondary | ICD-10-CM

## 2018-06-01 NOTE — Progress Notes (Signed)
   Subjective:    Patient ID: Frank HaHarry R Saini Jr., male    DOB: [birth year], 84 y.o.   MRN: [id number]  HPI 84 year old male presents the office today for concerns of thick, elongated toenails that he cannot trim himself.  His son states that when he gets thick and elongated because some redness to the area but denies any drainage or pus.  His son usually will try to trim the nails but they are thickened and he is unable to do this.  He has no other concerns.   Review of Systems  All other systems reviewed and are negative.  Past Medical History:  Diagnosis Date  . Aortic stenosis    Mild to moderate per echo in 2008  . Asymptomatic PVCs   . Hyperlipidemia   . Hypertension   . IHD (ischemic heart disease)    s/p CABG in 2001  . LV dysfunction    EF 50% per nuclear in 2008  . Memory loss   . PAC (premature atrial contraction)     Past Surgical History:  Procedure Laterality Date  . CARDIAC CATHETERIZATION  04/10/2000   EF 44-45%  . CARDIOVASCULAR STRESS TEST  08/24/2007   EF 50%  . CORONARY ARTERY BYPASS GRAFT  2001  . TONSILLECTOMY AND ADENOIDECTOMY    . US ECHOCARDIOGRAPHY  04/29/2007   EF 55-60%    No current outpatient medications on file.  No Known Allergies       Objective:   Physical Exam  General: NAD  Dermatological: Nails are hypertrophic, dystrophic, brittle, discolored, elongated 10. No surrounding redness or drainage. Tenderness nails 1-5 bilaterally. No open lesions or pre-ulcerative lesions are identified today.  Vascular: Dorsalis Pedis artery and Posterior Tibial artery pedal pulses are 2/4 bilateral with immedate capillary fill time. Pedal hair growth present. No varicosities and no lower extremity edema present bilateral. There is no pain with calf compression, swelling, warmth, erythema.   Neruologic: Grossly intact via light touch bilateral. Protective threshold with Semmes Wienstein monofilament intact to all pedal sites bilateral.    Musculoskeletal: No gross boney pedal deformities bilateral. No pain, crepitus, or limitation noted with foot and ankle range of motion bilateral. Muscular strength 5/5 in all groups tested bilateral.     Assessment & Plan:  84 year old male with symptomatic onychomycosis -Treatment options discussed including all alternatives, risks, and complications -Etiology of symptoms were discussed -Nails debrided 10 without complications or bleeding. -Daily foot inspection -Follow-up in 3 months if they wish routine care or sooner if any problems arise. In the meantime, encouraged to call the office with any questions, concerns, change in symptoms.   Ovid CurdMatthew Wagoner, DPM

## 2019-04-09 DIAGNOSIS — I10 Essential (primary) hypertension: Secondary | ICD-10-CM | POA: Diagnosis not present

## 2019-04-09 DIAGNOSIS — G309 Alzheimer's disease, unspecified: Secondary | ICD-10-CM | POA: Diagnosis not present

## 2019-04-09 DIAGNOSIS — R269 Unspecified abnormalities of gait and mobility: Secondary | ICD-10-CM | POA: Diagnosis not present

## 2019-04-09 DIAGNOSIS — R5383 Other fatigue: Secondary | ICD-10-CM | POA: Diagnosis not present

## 2019-04-23 ENCOUNTER — Other Ambulatory Visit: Payer: Self-pay | Admitting: Geriatric Medicine

## 2019-04-23 DIAGNOSIS — R269 Unspecified abnormalities of gait and mobility: Secondary | ICD-10-CM | POA: Diagnosis not present

## 2019-04-28 ENCOUNTER — Ambulatory Visit (HOSPITAL_COMMUNITY)
Admission: RE | Admit: 2019-04-28 | Discharge: 2019-04-28 | Disposition: A | Discharge status: Home / Self Care | Payer: Medicare Other | Source: Ambulatory Visit | Attending: Geriatric Medicine | Admitting: Geriatric Medicine

## 2019-04-28 ENCOUNTER — Other Ambulatory Visit: Payer: Self-pay

## 2019-04-28 DIAGNOSIS — R269 Unspecified abnormalities of gait and mobility: Secondary | ICD-10-CM | POA: Insufficient documentation

## 2019-04-28 DIAGNOSIS — F0390 Unspecified dementia without behavioral disturbance: Secondary | ICD-10-CM | POA: Diagnosis not present

## 2019-10-27 DIAGNOSIS — I5022 Chronic systolic (congestive) heart failure: Secondary | ICD-10-CM | POA: Diagnosis not present

## 2019-10-27 DIAGNOSIS — G309 Alzheimer's disease, unspecified: Secondary | ICD-10-CM | POA: Diagnosis not present

## 2019-10-27 DIAGNOSIS — I10 Essential (primary) hypertension: Secondary | ICD-10-CM | POA: Diagnosis not present

## 2019-11-21 ENCOUNTER — Emergency Department (HOSPITAL_COMMUNITY): Payer: Medicare Other

## 2019-11-21 ENCOUNTER — Encounter (HOSPITAL_COMMUNITY): Payer: Self-pay

## 2019-11-21 ENCOUNTER — Other Ambulatory Visit: Payer: Self-pay

## 2019-11-21 ENCOUNTER — Other Ambulatory Visit: Payer: Self-pay | Admitting: Physician Assistant

## 2019-11-21 ENCOUNTER — Inpatient Hospital Stay (HOSPITAL_COMMUNITY)
Admission: EM | Admit: 2019-11-21 | Discharge: 2019-11-26 | DRG: 521 | Disposition: A | Discharge status: Home Health | Payer: Medicare Other | Attending: Internal Medicine | Admitting: Internal Medicine

## 2019-11-21 DIAGNOSIS — I509 Heart failure, unspecified: Secondary | ICD-10-CM | POA: Diagnosis not present

## 2019-11-21 DIAGNOSIS — E7800 Pure hypercholesterolemia, unspecified: Secondary | ICD-10-CM | POA: Diagnosis present

## 2019-11-21 DIAGNOSIS — I5023 Acute on chronic systolic (congestive) heart failure: Secondary | ICD-10-CM | POA: Diagnosis not present

## 2019-11-21 DIAGNOSIS — J9601 Acute respiratory failure with hypoxia: Secondary | ICD-10-CM | POA: Diagnosis not present

## 2019-11-21 DIAGNOSIS — I5031 Acute diastolic (congestive) heart failure: Secondary | ICD-10-CM | POA: Diagnosis not present

## 2019-11-21 DIAGNOSIS — W19XXXA Unspecified fall, initial encounter: Secondary | ICD-10-CM

## 2019-11-21 DIAGNOSIS — S72002A Fracture of unspecified part of neck of left femur, initial encounter for closed fracture: Secondary | ICD-10-CM | POA: Diagnosis present

## 2019-11-21 DIAGNOSIS — N1832 Chronic kidney disease, stage 3b: Secondary | ICD-10-CM | POA: Diagnosis present

## 2019-11-21 DIAGNOSIS — S72009A Fracture of unspecified part of neck of unspecified femur, initial encounter for closed fracture: Secondary | ICD-10-CM | POA: Diagnosis present

## 2019-11-21 DIAGNOSIS — Y92009 Unspecified place in unspecified non-institutional (private) residence as the place of occurrence of the external cause: Secondary | ICD-10-CM | POA: Diagnosis not present

## 2019-11-21 DIAGNOSIS — Z01818 Encounter for other preprocedural examination: Secondary | ICD-10-CM | POA: Diagnosis not present

## 2019-11-21 DIAGNOSIS — W19XXXD Unspecified fall, subsequent encounter: Secondary | ICD-10-CM | POA: Diagnosis not present

## 2019-11-21 DIAGNOSIS — Z8249 Family history of ischemic heart disease and other diseases of the circulatory system: Secondary | ICD-10-CM | POA: Diagnosis not present

## 2019-11-21 DIAGNOSIS — N390 Urinary tract infection, site not specified: Secondary | ICD-10-CM | POA: Diagnosis present

## 2019-11-21 DIAGNOSIS — I350 Nonrheumatic aortic (valve) stenosis: Secondary | ICD-10-CM

## 2019-11-21 DIAGNOSIS — Z951 Presence of aortocoronary bypass graft: Secondary | ICD-10-CM

## 2019-11-21 DIAGNOSIS — Z471 Aftercare following joint replacement surgery: Secondary | ICD-10-CM | POA: Diagnosis not present

## 2019-11-21 DIAGNOSIS — I25119 Atherosclerotic heart disease of native coronary artery with unspecified angina pectoris: Secondary | ICD-10-CM | POA: Diagnosis not present

## 2019-11-21 DIAGNOSIS — Z888 Allergy status to other drugs, medicaments and biological substances status: Secondary | ICD-10-CM

## 2019-11-21 DIAGNOSIS — M25562 Pain in left knee: Secondary | ICD-10-CM | POA: Diagnosis not present

## 2019-11-21 DIAGNOSIS — Z20822 Contact with and (suspected) exposure to covid-19: Secondary | ICD-10-CM | POA: Diagnosis present

## 2019-11-21 DIAGNOSIS — Z96642 Presence of left artificial hip joint: Secondary | ICD-10-CM

## 2019-11-21 DIAGNOSIS — G309 Alzheimer's disease, unspecified: Secondary | ICD-10-CM | POA: Diagnosis present

## 2019-11-21 DIAGNOSIS — Z823 Family history of stroke: Secondary | ICD-10-CM

## 2019-11-21 DIAGNOSIS — R410 Disorientation, unspecified: Secondary | ICD-10-CM | POA: Diagnosis not present

## 2019-11-21 DIAGNOSIS — S72001A Fracture of unspecified part of neck of right femur, initial encounter for closed fracture: Secondary | ICD-10-CM | POA: Diagnosis not present

## 2019-11-21 DIAGNOSIS — Z419 Encounter for procedure for purposes other than remedying health state, unspecified: Secondary | ICD-10-CM

## 2019-11-21 DIAGNOSIS — R0682 Tachypnea, not elsewhere classified: Secondary | ICD-10-CM

## 2019-11-21 DIAGNOSIS — B9620 Unspecified Escherichia coli [E. coli] as the cause of diseases classified elsewhere: Secondary | ICD-10-CM | POA: Diagnosis present

## 2019-11-21 DIAGNOSIS — W1830XA Fall on same level, unspecified, initial encounter: Secondary | ICD-10-CM | POA: Diagnosis present

## 2019-11-21 DIAGNOSIS — I5021 Acute systolic (congestive) heart failure: Secondary | ICD-10-CM | POA: Diagnosis not present

## 2019-11-21 DIAGNOSIS — Z7189 Other specified counseling: Secondary | ICD-10-CM | POA: Diagnosis not present

## 2019-11-21 DIAGNOSIS — I429 Cardiomyopathy, unspecified: Secondary | ICD-10-CM | POA: Diagnosis present

## 2019-11-21 DIAGNOSIS — I2510 Atherosclerotic heart disease of native coronary artery without angina pectoris: Secondary | ICD-10-CM | POA: Diagnosis present

## 2019-11-21 DIAGNOSIS — I351 Nonrheumatic aortic (valve) insufficiency: Secondary | ICD-10-CM | POA: Diagnosis present

## 2019-11-21 DIAGNOSIS — I10 Essential (primary) hypertension: Secondary | ICD-10-CM | POA: Diagnosis present

## 2019-11-21 DIAGNOSIS — R000 Tachycardia, unspecified: Secondary | ICD-10-CM | POA: Diagnosis not present

## 2019-11-21 DIAGNOSIS — I130 Hypertensive heart and chronic kidney disease with heart failure and stage 1 through stage 4 chronic kidney disease, or unspecified chronic kidney disease: Secondary | ICD-10-CM | POA: Diagnosis present

## 2019-11-21 DIAGNOSIS — I110 Hypertensive heart disease with heart failure: Secondary | ICD-10-CM | POA: Diagnosis not present

## 2019-11-21 DIAGNOSIS — E785 Hyperlipidemia, unspecified: Secondary | ICD-10-CM | POA: Diagnosis present

## 2019-11-21 DIAGNOSIS — Z7401 Bed confinement status: Secondary | ICD-10-CM | POA: Diagnosis not present

## 2019-11-21 DIAGNOSIS — F0280 Dementia in other diseases classified elsewhere without behavioral disturbance: Secondary | ICD-10-CM | POA: Diagnosis present

## 2019-11-21 DIAGNOSIS — M2550 Pain in unspecified joint: Secondary | ICD-10-CM | POA: Diagnosis not present

## 2019-11-21 DIAGNOSIS — S72012A Unspecified intracapsular fracture of left femur, initial encounter for closed fracture: Secondary | ICD-10-CM | POA: Diagnosis not present

## 2019-11-21 DIAGNOSIS — I4891 Unspecified atrial fibrillation: Secondary | ICD-10-CM | POA: Diagnosis present

## 2019-11-21 DIAGNOSIS — S8992XA Unspecified injury of left lower leg, initial encounter: Secondary | ICD-10-CM | POA: Diagnosis not present

## 2019-11-21 DIAGNOSIS — Z66 Do not resuscitate: Secondary | ICD-10-CM | POA: Diagnosis present

## 2019-11-21 DIAGNOSIS — I959 Hypotension, unspecified: Secondary | ICD-10-CM | POA: Diagnosis not present

## 2019-11-21 DIAGNOSIS — S0990XA Unspecified injury of head, initial encounter: Secondary | ICD-10-CM | POA: Diagnosis not present

## 2019-11-21 DIAGNOSIS — S199XXA Unspecified injury of neck, initial encounter: Secondary | ICD-10-CM | POA: Diagnosis not present

## 2019-11-21 DIAGNOSIS — Z515 Encounter for palliative care: Secondary | ICD-10-CM | POA: Diagnosis not present

## 2019-11-21 DIAGNOSIS — Z03818 Encounter for observation for suspected exposure to other biological agents ruled out: Secondary | ICD-10-CM | POA: Diagnosis not present

## 2019-11-21 LAB — CBC WITH DIFFERENTIAL/PLATELET
Abs Immature Granulocytes: 0.05 10*3/uL (ref 0.00–0.07)
Basophils Absolute: 0.1 10*3/uL (ref 0.0–0.1)
Basophils Relative: 0 %
Eosinophils Absolute: 0 10*3/uL (ref 0.0–0.5)
Eosinophils Relative: 0 %
HCT: 47.4 % (ref 39.0–52.0)
Hemoglobin: 15.3 g/dL (ref 13.0–17.0)
Immature Granulocytes: 0 %
Lymphocytes Relative: 8 %
Lymphs Abs: 0.8 10*3/uL (ref 0.7–4.0)
MCH: 30.4 pg (ref 26.0–34.0)
MCHC: 32.3 g/dL (ref 30.0–36.0)
MCV: 94 fL (ref 80.0–100.0)
Monocytes Absolute: 0.5 10*3/uL (ref 0.1–1.0)
Monocytes Relative: 5 %
Neutro Abs: 9.6 10*3/uL — ABNORMAL HIGH (ref 1.7–7.7)
Neutrophils Relative %: 87 %
Platelets: 190 10*3/uL (ref 150–400)
RBC: 5.04 MIL/uL (ref 4.22–5.81)
RDW: 13 % (ref 11.5–15.5)
WBC: 11.1 10*3/uL — ABNORMAL HIGH (ref 4.0–10.5)
nRBC: 0 % (ref 0.0–0.2)

## 2019-11-21 LAB — BASIC METABOLIC PANEL
Anion gap: 6 (ref 5–15)
BUN: 37 mg/dL — ABNORMAL HIGH (ref 8–23)
CO2: 26 mmol/L (ref 22–32)
Calcium: 9.3 mg/dL (ref 8.9–10.3)
Chloride: 105 mmol/L (ref 98–111)
Creatinine, Ser: 1.5 mg/dL — ABNORMAL HIGH (ref 0.61–1.24)
GFR calc Af Amer: 47 mL/min — ABNORMAL LOW (ref >60)
GFR calc non Af Amer: 40 mL/min — ABNORMAL LOW (ref >60)
Glucose, Bld: 144 mg/dL — ABNORMAL HIGH (ref 70–99)
Potassium: 3.8 mmol/L (ref 3.5–5.1)
Sodium: 137 mmol/L (ref 135–145)

## 2019-11-21 LAB — URINALYSIS, ROUTINE W REFLEX MICROSCOPIC
Bilirubin Urine: NEGATIVE
Glucose, UA: NEGATIVE mg/dL
Ketones, ur: NEGATIVE mg/dL
Nitrite: NEGATIVE
Protein, ur: NEGATIVE mg/dL
Specific Gravity, Urine: 1.02 (ref 1.005–1.030)
pH: 5 (ref 5.0–8.0)

## 2019-11-21 LAB — CK: Total CK: 49 U/L (ref 49–397)

## 2019-11-21 LAB — TROPONIN I (HIGH SENSITIVITY)
Troponin I (High Sensitivity): 204 ng/L (ref <18)
Troponin I (High Sensitivity): 172 ng/L (ref <18)

## 2019-11-21 LAB — SARS CORONAVIRUS 2 (TAT 6-24 HRS): SARS Coronavirus 2: NEGATIVE

## 2019-11-21 MED ORDER — ONDANSETRON HCL 4 MG/2ML IJ SOLN
4.0000 mg | Freq: Once | INTRAMUSCULAR | Status: AC
  Administered 2019-11-21: 4 mg via INTRAVENOUS
  Filled 2019-11-21: qty 2

## 2019-11-21 MED ORDER — FENTANYL CITRATE (PF) 100 MCG/2ML IJ SOLN
50.0000 ug | Freq: Once | INTRAMUSCULAR | Status: AC
  Administered 2019-11-21: 50 ug via INTRAVENOUS
  Filled 2019-11-21: qty 2

## 2019-11-21 MED ORDER — ACETAMINOPHEN 650 MG RE SUPP: 650.0000 mg | Freq: Four times a day (QID) | RECTAL | Status: DC | PRN

## 2019-11-21 MED ORDER — ACETAMINOPHEN 325 MG PO TABS: 650.0000 mg | ORAL_TABLET | Freq: Four times a day (QID) | ORAL | Status: DC | PRN

## 2019-11-21 MED ORDER — SENNA 8.6 MG PO TABS
1.0000 | ORAL_TABLET | Freq: Two times a day (BID) | ORAL | Status: DC
  Administered 2019-11-21 – 2019-11-26 (×5): 8.6 mg via ORAL
  Filled 2019-11-21 (×8): qty 1

## 2019-11-21 MED ORDER — HALOPERIDOL LACTATE 5 MG/ML IJ SOLN
2.0000 mg | Freq: Four times a day (QID) | INTRAMUSCULAR | Status: DC | PRN
  Administered 2019-11-21 – 2019-11-22 (×2): 2 mg via INTRAVENOUS
  Filled 2019-11-21 (×3): qty 1

## 2019-11-21 MED ORDER — ONDANSETRON HCL 4 MG/2ML IJ SOLN: 4.0000 mg | Freq: Four times a day (QID) | INTRAMUSCULAR | Status: DC | PRN

## 2019-11-21 MED ORDER — FLEET ENEMA 7-19 GM/118ML RE ENEM: 1.0000 | ENEMA | Freq: Once | RECTAL | Status: DC | PRN

## 2019-11-21 MED ORDER — SODIUM CHLORIDE 0.9 % IV SOLN: INTRAVENOUS | Status: DC

## 2019-11-21 MED ORDER — SODIUM CHLORIDE 0.9 % IV BOLUS
1000.0000 mL | Freq: Once | INTRAVENOUS | Status: AC
  Administered 2019-11-21: 1000 mL via INTRAVENOUS

## 2019-11-21 MED ORDER — SODIUM CHLORIDE 0.9% FLUSH
3.0000 mL | Freq: Two times a day (BID) | INTRAVENOUS | Status: DC
  Administered 2019-11-21 – 2019-11-26 (×8): 3 mL via INTRAVENOUS

## 2019-11-21 MED ORDER — ONDANSETRON HCL 4 MG PO TABS: 4.0000 mg | ORAL_TABLET | Freq: Four times a day (QID) | ORAL | Status: DC | PRN

## 2019-11-21 MED ORDER — LORAZEPAM 2 MG/ML IJ SOLN
0.5000 mg | Freq: Once | INTRAMUSCULAR | Status: AC
  Administered 2019-11-21: 15:00:00 0.5 mg via INTRAVENOUS
  Filled 2019-11-21: qty 1

## 2019-11-21 MED ORDER — SORBITOL 70 % SOLN
30.0000 mL | Freq: Every day | Status: DC | PRN
  Filled 2019-11-21: qty 30

## 2019-11-21 NOTE — H&P (Signed)
[facility] History and Physical  Adriano Bischof. PFX:[id number] DOB: [birth year] DOA: 11/21/2019 Referring physician: ED PCP: Merlene Laughter, MD  Chief Complaint: Fall, left femur fracture Came from: Home ------------------------------------------------------------------------------------------------------ Assessment/Plan: Active Problems:   Hip fracture (HCC)  Left femur fracture -Presented from home after a fall and impact on left hip  -X-ray showed acute subcapital or basicervical LEFT femoral neck fracture -Orthopedic consulted from ED.  Tentative plan of surgical fixation tomorrow.  Perioperative risk assessment -Patient has cardiac history of CAD, CABG in 2001.  Currently not on any cardiac medications. -Tolerated CABG without any complications of anesthesia -No history of DVT/PE.  No bleeding tendency -Physically functional with a cane prior to fall yesterday -I discussed with patient's daughter about the risk and benefit of surgery at the age of 86.  Family wants to proceed with surgery.   -Daughter does not mention any complaint of chest pain, shortness of breath, chest discomfort, palpitations, dizziness, syncope. - troponin was initially elevated to 204, subsequently down to 172.  EKG does not show any ST-T wave changes.  EKG read as A. fib but P waves are discernible. -Last echo available in the system is from 2008 only.  I would at least get an echocardiogram.  If no significant drop in EF or valvular abnormality or wall motion abnormality, can proceed with surgery.  Consider cardiology consultation for any such finding.  Alzheimer's dementia with episodes of agitation -Patient required Ativan IV earlier in the ED -I will order for Haldol 2 mg IV every 6 hours as needed.  QTC 481 ms.  Cardiovascular issues: HTN, HLD, CAD s/p CABG 2001, ischemic heart disease, mild systolic CHF, mild to moderate aortic stenosis. -Not on any medicines at home.  Echocardiogram  ordered. -Continue to monitor blood pressure and cardiac symptoms.  Mobility: PT eval postprocedure Diet: Cardiac diet for now.  N.p.o. after midnight DVT prophylaxis:  SCD boots for now.  Consider Lovenox postprocedure Code Status:  DNR/DNI.  Confirmed with daughter at bedside Family Communication:  Discussed with daughter Ms. Mary at bedside Disposition Plan:  Depends on PT eval post procedure  ----------------------------------------------------------------------------------------------------- History of Present Illness: Frank Gates. is a 86 y.o. male with PMH significant for Alzheimer's dementia, HTN, HLD, CAD s/p CABG 2001, ischemic heart disease, mild systolic CHF, mild to moderate aortic stenosis. Patient was brought to the ED today after a fall yesterday. Patient lives at home with his son and is supposed to walk with the assistance of a cane but he tends to forget.  He had an unwitnessed fall yesterday afternoon after which he started complaining of left leg and left hip pain. He was able to put minimal weight on the left leg with some pain that progressively worsened today and hence he was brought to the ED.  In the ED, patient was afebrile, heart rate fluctuated between 47-118.  Blood pressure in low side of normal, breathing comfortably on room air. Work-up showed sodium 137, potassium 3.8, BUN/creatinine 37/1.5, troponin was initially elevated to 204, subsequently down to 172. WBC count slightly elevated to 11.1, hemoglobin 15.3, platelet 190 Covid PCR test pending. Urinalysis showed clear amber clear urine with trace leukocytes and many bacteria Skeletal survey showed acute subcapital or basicervical LEFT femoral neck fracture. Initial troponin was elevated to 204 and subsequently 272.  EKG did not show any suspicious ST-T wave changes.  At the time of my evaluation, patient was accompanied by his daughter/POA [name].  Patient himself was  sedated after he got a dose of  Ativan earlier for agitation. Patient lives with his son.  Daughter also lives close by. He is physically strong  but has significant dementia with frequent episodes of agitation.  Usually he calms down after talking to his kids.  He is apparently not on any medications at home, not even a baby aspirin.  Daughter states that he was taken off medication several years ago.  Review of Systems:  All systems were reviewed and were negative unless otherwise mentioned in the HPI   Past medical history: Past Medical History:  Diagnosis Date  . Aortic stenosis    Mild to moderate per echo in 2008  . Asymptomatic PVCs   . Hyperlipidemia   . Hypertension   . IHD (ischemic heart disease)    s/p CABG in 2001  . LV dysfunction    EF 50% per nuclear in 2008  . Memory loss   . PAC (premature atrial contraction)     Past surgical history: Past Surgical History:  Procedure Laterality Date  . CARDIAC CATHETERIZATION  04/10/2000   EF 44-45%  . CARDIOVASCULAR STRESS TEST  08/24/2007   EF 50%  . CORONARY ARTERY BYPASS GRAFT  2001  . TONSILLECTOMY AND ADENOIDECTOMY    . US ECHOCARDIOGRAPHY  04/29/2007   EF 55-60%    Social History:  reports that he has never smoked. He has never used smokeless tobacco. He reports that he does not drink alcohol or use drugs.  Allergies:  No Known Allergies  Family history:  Family History  Problem Relation Age of Onset  . Stroke Mother   . Heart failure Father      Home Meds: Prior to Admission medications   Medication Sig Start Date End Date Taking? Authorizing Provider  furosemide (LASIX) 20 MG tablet Take 20 mg by mouth daily. 10/19/19  Yes [provider]    Physical Exam: Vitals:   11/21/19 1415 11/21/19 1430 11/21/19 1445 11/21/19 1500  BP:  105/89  105/90  Pulse: (!) 116 (!) 57 (!) 114 (!) 47  Resp: 19 (!) 24 (!) 21 10  Temp:      TempSrc:      SpO2: 100% 91% 100% 100%   Wt Readings from Last 3 Encounters:  08/30/15 82.5 kg    04/26/14 79.4 kg  06/21/13 79.8 kg   There is no height or weight on file to calculate BMI.  General exam: Sedated.  Tries to open eyes on touch.  Not restless or agitated at this time Skin: No rashes, lesions or ulcers. HEENT: Atraumatic, normocephalic, supple neck, no obvious bleeding Lungs: Clear to auscultation bilaterally CVS: Regular rate and rhythm, no murmur GI/Abd soft, nondistended, nontender, bowel sound present CNS: Sedated.  Demented at baseline Psychiatry: Mood unable to assess Extremities: No pedal edema, no calf tenderness     Consult Orders  (From admission, onward)         Start     Ordered   11/21/19 1444  Consult to hospitalist  ALL PATIENTS BEING ADMITTED/HAVING PROCEDURES NEED COVID-19 SCREENING  Once    Comments: ALL PATIENTS BEING ADMITTED/HAVING PROCEDURES NEED COVID-19 SCREENING  Provider:  (Not yet assigned)  Question Answer Comment  Place call to: [facility]   Reason for Consult Admit      11/21/19 1443          Labs on Admission:   CBC: Recent Labs  Lab 11/21/19 1123  WBC 11.1*  NEUTROABS 9.6*  HGB  15.3  HCT 47.4  MCV 94.0  PLT 324    Basic Metabolic Panel: Recent Labs  Lab 11/21/19 1123  NA 137  K 3.8  CL 105  CO2 26  GLUCOSE 144*  BUN 37*  CREATININE 1.50*  CALCIUM 9.3    Liver Function Tests: No results for input(s): AST, ALT, ALKPHOS, BILITOT, PROT, ALBUMIN in the last 168 hours. No results for input(s): LIPASE, AMYLASE in the last 168 hours. No results for input(s): AMMONIA in the last 168 hours.  Cardiac Enzymes: Recent Labs  Lab 11/21/19 1123  CKTOTAL 49    BNP (last 3 results) No results for input(s): BNP in the last 8760 hours.  ProBNP (last 3 results) No results for input(s): PROBNP in the last 8760 hours.  CBG: No results for input(s): GLUCAP in the last 168 hours.  Lipase  No results found for: LIPASE   Urinalysis    Component Value Date/Time   COLORURINE AMBER (A) 11/21/2019  1115   APPEARANCEUR CLEAR 11/21/2019 1115   LABSPEC 1.020 11/21/2019 1115   PHURINE 5.0 11/21/2019 1115   GLUCOSEU NEGATIVE 11/21/2019 1115   HGBUR SMALL (A) 11/21/2019 1115   BILIRUBINUR NEGATIVE 11/21/2019 1115   KETONESUR NEGATIVE 11/21/2019 1115   PROTEINUR NEGATIVE 11/21/2019 1115   NITRITE NEGATIVE 11/21/2019 1115   LEUKOCYTESUR TRACE (A) 11/21/2019 1115     Drugs of Abuse  No results found for: LABOPIA, COCAINSCRNUR, LABBENZ, AMPHETMU, THCU, LABBARB    Radiological Exams on Admission: DG Knee 1-2 Views Left  Result Date: 11/21/2019 CLINICAL DATA:  86 year old who fell and complains of LEFT hip and LEFT UPPER leg pain. Initial encounter. EXAM: LEFT KNEE - 1-2 VIEW COMPARISON:  None. FINDINGS: No evidence of acute fracture or dislocation. Moderate MEDIAL and LATERAL compartment joint space narrowing. Patellofemoral joint space well-preserved. Enthesopathic spurring at the insertion of the quadriceps tendon on the SUPERIOR patella and at the insertion of the patellar tendon on the INFERIOR patella. No visible joint effusion. Femoropopliteal atherosclerosis. IMPRESSION: 1. No acute osseous abnormality. 2. Moderate MEDIAL and LATERAL compartment osteoarthritis. Electronically Signed   By: Evangeline Dakin M.D.   On: 11/21/2019 12:30   CT Head Wo Contrast  Result Date: 11/21/2019 CLINICAL DATA:  Unwitnessed fall. EXAM: CT HEAD WITHOUT CONTRAST CT CERVICAL SPINE WITHOUT CONTRAST TECHNIQUE: Multidetector CT imaging of the head and cervical spine was performed following the standard protocol without intravenous contrast. Multiplanar CT image reconstructions of the cervical spine were also generated. COMPARISON:  October 25, 2010. FINDINGS: CT HEAD FINDINGS Brain: Mild diffuse cortical atrophy is noted. Mild chronic ischemic white matter disease is noted. No mass effect or midline shift is noted. Ventricular size is within normal limits. There is no evidence of mass lesion, hemorrhage or acute  infarction. Vascular: No hyperdense vessel or unexpected calcification. Skull: Normal. Negative for fracture or focal lesion. Sinuses/Orbits: No acute finding. Other: None. CT CERVICAL SPINE FINDINGS Alignment: Normal. Skull base and vertebrae: No acute fracture. No primary bone lesion or focal pathologic process. Soft tissues and spinal canal: No prevertebral fluid or swelling. No visible canal hematoma. Disc levels: Mild degenerative disc disease is noted at C4-5, C5-6 and C6-7. Upper chest: Negative. Other: Degenerative changes are seen involving posterior facet joints bilaterally. IMPRESSION: 1. Mild diffuse cortical atrophy. Mild chronic ischemic white matter disease. No acute intracranial abnormality seen. 2. Multilevel degenerative disc disease. No acute abnormality seen in the cervical spine. Electronically Signed   By: Marijo Conception M.D.   On:  11/21/2019 12:36   CT Cervical Spine Wo Contrast  Result Date: 11/21/2019 CLINICAL DATA:  Unwitnessed fall. EXAM: CT HEAD WITHOUT CONTRAST CT CERVICAL SPINE WITHOUT CONTRAST TECHNIQUE: Multidetector CT imaging of the head and cervical spine was performed following the standard protocol without intravenous contrast. Multiplanar CT image reconstructions of the cervical spine were also generated. COMPARISON:  October 25, 2010. FINDINGS: CT HEAD FINDINGS Brain: Mild diffuse cortical atrophy is noted. Mild chronic ischemic white matter disease is noted. No mass effect or midline shift is noted. Ventricular size is within normal limits. There is no evidence of mass lesion, hemorrhage or acute infarction. Vascular: No hyperdense vessel or unexpected calcification. Skull: Normal. Negative for fracture or focal lesion. Sinuses/Orbits: No acute finding. Other: None. CT CERVICAL SPINE FINDINGS Alignment: Normal. Skull base and vertebrae: No acute fracture. No primary bone lesion or focal pathologic process. Soft tissues and spinal canal: No prevertebral fluid or swelling.  No visible canal hematoma. Disc levels: Mild degenerative disc disease is noted at C4-5, C5-6 and C6-7. Upper chest: Negative. Other: Degenerative changes are seen involving posterior facet joints bilaterally. IMPRESSION: 1. Mild diffuse cortical atrophy. Mild chronic ischemic white matter disease. No acute intracranial abnormality seen. 2. Multilevel degenerative disc disease. No acute abnormality seen in the cervical spine. Electronically Signed   By: Lupita Raider M.D.   On: 11/21/2019 12:36   DG Chest Portable 1 View  Result Date: 11/21/2019 CLINICAL DATA:  Preop. EXAM: PORTABLE CHEST 1 VIEW COMPARISON:  May 29, 2011. FINDINGS: Mild cardiomegaly is noted. No pneumothorax or pleural effusion is noted. Status post coronary bypass graft. Both lungs are clear. The visualized skeletal structures are unremarkable. IMPRESSION: No active disease. Electronically Signed   By: Lupita Raider M.D.   On: 11/21/2019 13:52   DG Hip Unilat W or Wo Pelvis 2-3 Views Left  Result Date: 11/21/2019 CLINICAL DATA:  86 year old who fell and complains of LEFT hip and LEFT UPPER leg pain. Initial encounter. EXAM: DG HIP (WITH OR WITHOUT PELVIS) 2-3V LEFT COMPARISON:  None. FINDINGS: Acute subcapital or basicervical LEFT femoral neck fracture. LEFT hip joint anatomically aligned with well-preserved joint space for age. Bone mineral density well preserved for age. Included AP pelvis demonstrates no fractures elsewhere. Well preserved joint space in the contralateral RIGHT hip. Sacroiliac joints anatomically aligned without diastasis, demonstrating only mild degenerative changes. Symphysis pubis anatomically aligned without diastasis or degenerative changes. IMPRESSION: Acute subcapital or basicervical LEFT femoral neck fracture. Electronically Signed   By: Hulan Saas M.D.   On: 11/21/2019 12:28   DG Femur Min 2 Views Left  Result Date: 11/21/2019 CLINICAL DATA:  86 year old who fell and complains of LEFT hip and LEFT  UPPER leg pain. Initial encounter. EXAM: LEFT FEMUR 2 VIEWS COMPARISON:  None. FINDINGS: Subcapital/basicervical LEFT femoral neck fracture as detailed on the report of the concurrent LEFT hip images. No fractures elsewhere involving the femur. Well-preserved bone mineral density. Femoropopliteal atherosclerosis. IMPRESSION: Subcapital/basicervical LEFT femoral neck fracture as detailed on the report of the concurrent LEFT hip images. No fractures elsewhere involving the femur. Electronically Signed   By: Hulan Saas M.D.   On: 11/21/2019 12:32   ----------------------------------------------------------------------------------------------------------------------------------------------------------- Severity of Illness: The appropriate patient status for this patient is INPATIENT. Inpatient status is judged to be reasonable and necessary in order to provide the required intensity of service to ensure the patient's safety. The patient's presenting symptoms, physical exam findings, and initial radiographic and laboratory data in the context of their chronic  comorbidities is felt to place them at high risk for further clinical deterioration. Furthermore, it is not anticipated that the patient will be medically stable for discharge from the hospital within 2 midnights of admission. The following factors support the patient status of inpatient.   " The patient's presenting symptoms include fall. " The worrisome physical exam findings include left hip tenderness. " The initial radiographic and laboratory data are worrisome because of left femur fracture. " The chronic co-morbidities include dementia, CAD.   * I certify that at the point of admission it is my clinical judgment that the patient will require inpatient hospital care spanning beyond 2 midnights from the point of admission due to high intensity of service, high risk for further deterioration and high frequency of surveillance  required.*   Signed, Lorin Glass, MD [facility] 11/21/2019

## 2019-11-21 NOTE — ED Provider Notes (Signed)
[facility] Provider Note   CSN: [id number] Arrival date & time: 11/21/19  1043     History Chief Complaint  Patient presents with  . Hip Pain    Frank Gates. is a 86 y.o. male.  The history is provided by a relative (Daughter, Frank Gates).        Frank Gates. is a 86 y.o. male, with a history of Alzheimer's dementia, HTN, hyperlipidemia, CABG, presenting to the ED with left hip pain following a fall that occurred yesterday afternoon.  Patient lives at home, and is supposed to walk with the assistance of a cane, but tends to forget due to his dementia.  He had an unwitnessed fall yesterday, was complaining of left leg pain, was able to put some weight on it, so the decided to wait until today. Patient was complaining of more pain today so they decided to come to the ED.  History elements given by patient's daughter and POA, Frank Gates, at the bedside.  She confirms patient's mental status is at baseline.  She also confirms his history of Alzheimer's dementia and that he takes no medications.  Patient denies any other complaints, however, daughter states patient is not always reliable.   Past Medical History:  Diagnosis Date  . Aortic stenosis    Mild to moderate per echo in 2008  . Asymptomatic PVCs   . Hyperlipidemia   . Hypertension   . IHD (ischemic heart disease)    s/p CABG in 2001  . LV dysfunction    EF 50% per nuclear in 2008  . Memory loss   . PAC (premature atrial contraction)     Patient Active Problem List   Diagnosis Date Noted  . Hip fracture (HCC) 11/21/2019  . PVC's (premature ventricular contractions) 04/26/2014  . Hypercholesterolemia 06/21/2013  . CAD (coronary artery disease) 05/29/2011  . HTN (hypertension) 05/29/2011  . Memory deficit 05/29/2011  . Abnormal CXR 05/29/2011  . Aortic stenosis 05/29/2011    Past Surgical History:  Procedure Laterality Date  . CARDIAC CATHETERIZATION  04/10/2000   EF 44-45%    . CARDIOVASCULAR STRESS TEST  08/24/2007   EF 50%  . CORONARY ARTERY BYPASS GRAFT  2001  . TONSILLECTOMY AND ADENOIDECTOMY    . US ECHOCARDIOGRAPHY  04/29/2007   EF 55-60%       Family History  Problem Relation Age of Onset  . Stroke Mother   . Heart failure Father     Social History   Tobacco Use  . Smoking status: Never Smoker  . Smokeless tobacco: Never Used  Substance Use Topics  . Alcohol use: No  . Drug use: No    Home Medications Prior to Admission medications   Medication Sig Start Date End Date Taking? Authorizing Provider  furosemide (LASIX) 20 MG tablet Take 20 mg by mouth daily. 10/19/19  Yes [provider]    Allergies    Patient has no known allergies.  Review of Systems   Review of Systems  Unable to perform ROS: Dementia  Musculoskeletal: Positive for arthralgias.    Physical Exam Updated Vital Signs BP 116/89 (BP Location: Left Arm)   Pulse (!) 113   Temp 98.2 F (36.8 C) (Oral)   Resp 16   SpO2 100%   Physical Exam Vitals and nursing note reviewed.  Constitutional:      General: He is not in acute distress.    Appearance: He is well-developed. He is not diaphoretic.  HENT:     Head: Normocephalic and atraumatic.     Mouth/Throat:     Mouth: Mucous membranes are moist.     Pharynx: Oropharynx is clear.  Eyes:     Conjunctiva/sclera: Conjunctivae normal.  Cardiovascular:     Rate and Rhythm: Regular rhythm. Tachycardia present.     Pulses: Normal pulses.          Radial pulses are 2+ on the right side and 2+ on the left side.       Posterior tibial pulses are 2+ on the right side and 2+ on the left side.     Heart sounds: Normal heart sounds.     Comments: Tactile temperature in the extremities appropriate and equal bilaterally. Mildly tachycardic. Pulmonary:     Effort: Pulmonary effort is normal. No respiratory distress.     Breath sounds: Normal breath sounds.  Abdominal:     Palpations: Abdomen is soft.      Tenderness: There is no abdominal tenderness. There is no guarding.  Musculoskeletal:     Cervical back: Neck supple.     Left hip: Deformity (vs swelling) and tenderness present.     Left knee: No swelling, deformity, erythema or ecchymosis.     Right lower leg: No edema.     Left lower leg: Edema (mild) present.     Right ankle: Normal.     Left ankle: No deformity or ecchymosis. No tenderness.       Legs:     Comments: Normal motor function intact in all other extremities. No midline spinal tenderness.   Overall trauma exam performed without any abnormalities noted other than those mentioned.  Lymphadenopathy:     Cervical: No cervical adenopathy.  Skin:    General: Skin is warm and dry.  Neurological:     Mental Status: He is alert.     Comments: Sensation to light touch grossly intact in the extremities. Grip strength equal bilaterally. Appropriate motor function intact in the lower extremities.  Psychiatric:        Mood and Affect: Mood and affect normal.        Speech: Speech normal.        Behavior: Behavior normal.     ED Results / Procedures / Treatments   Labs (all labs ordered are listed, but only abnormal results are displayed) Labs Reviewed  BASIC METABOLIC PANEL - Abnormal; Notable for the following components:      Result Value   Glucose, Bld 144 (*)    BUN 37 (*)    Creatinine, Ser 1.50 (*)    GFR calc non Af Amer 40 (*)    GFR calc Af Amer 47 (*)    All other components within normal limits  CBC WITH DIFFERENTIAL/PLATELET - Abnormal; Notable for the following components:   WBC 11.1 (*)    Neutro Abs 9.6 (*)    All other components within normal limits  URINALYSIS, ROUTINE W REFLEX MICROSCOPIC - Abnormal; Notable for the following components:   Color, Urine AMBER (*)    Hgb urine dipstick SMALL (*)    Leukocytes,Ua TRACE (*)    Bacteria, UA MANY (*)    All other components within normal limits  TROPONIN I (HIGH SENSITIVITY) - Abnormal; Notable for  the following components:   Troponin I (High Sensitivity) 204 (*)    All other components within normal limits  TROPONIN I (HIGH SENSITIVITY) - Abnormal; Notable for the following components:   Troponin I (High Sensitivity)  172 (*)    All other components within normal limits  SARS CORONAVIRUS 2 (TAT 6-24 HRS)  URINE CULTURE  CK    EKG EKG Interpretation  Date/Time:  Sunday November 21 2019 11:33:02 EST Ventricular Rate:  93 PR Interval:    QRS Duration: 119 QT Interval:  394 QTC Calculation: 491 R Axis:   -56 Text Interpretation: Poor baseline Ventricular premature complex Nonspecific IVCD with LAD Reconfirmed by Dene Gentry [phone]) on 11/21/2019 1:15:53 PM   EKG Interpretation  Date/Time:  Sunday November 21 2019 13:25:19 EST Ventricular Rate:  109 PR Interval:    QRS Duration: 112 QT Interval:  357 QTC Calculation: 481 R Axis:   -53 Text Interpretation: Sinus tachycardia Confirmed by Dene Gentry [phone]) on 11/21/2019 1:58:48 PM       Radiology DG Knee 1-2 Views Left  Result Date: 11/21/2019 CLINICAL DATA:  86 year old who fell and complains of LEFT hip and LEFT UPPER leg pain. Initial encounter. EXAM: LEFT KNEE - 1-2 VIEW COMPARISON:  None. FINDINGS: No evidence of acute fracture or dislocation. Moderate MEDIAL and LATERAL compartment joint space narrowing. Patellofemoral joint space well-preserved. Enthesopathic spurring at the insertion of the quadriceps tendon on the SUPERIOR patella and at the insertion of the patellar tendon on the INFERIOR patella. No visible joint effusion. Femoropopliteal atherosclerosis. IMPRESSION: 1. No acute osseous abnormality. 2. Moderate MEDIAL and LATERAL compartment osteoarthritis. Electronically Signed   By: Evangeline Dakin M.D.   On: 11/21/2019 12:30   CT Head Wo Contrast  Result Date: 11/21/2019 CLINICAL DATA:  Unwitnessed fall. EXAM: CT HEAD WITHOUT CONTRAST CT CERVICAL SPINE WITHOUT CONTRAST TECHNIQUE: Multidetector CT imaging of  the head and cervical spine was performed following the standard protocol without intravenous contrast. Multiplanar CT image reconstructions of the cervical spine were also generated. COMPARISON:  October 25, 2010. FINDINGS: CT HEAD FINDINGS Brain: Mild diffuse cortical atrophy is noted. Mild chronic ischemic white matter disease is noted. No mass effect or midline shift is noted. Ventricular size is within normal limits. There is no evidence of mass lesion, hemorrhage or acute infarction. Vascular: No hyperdense vessel or unexpected calcification. Skull: Normal. Negative for fracture or focal lesion. Sinuses/Orbits: No acute finding. Other: None. CT CERVICAL SPINE FINDINGS Alignment: Normal. Skull base and vertebrae: No acute fracture. No primary bone lesion or focal pathologic process. Soft tissues and spinal canal: No prevertebral fluid or swelling. No visible canal hematoma. Disc levels: Mild degenerative disc disease is noted at C4-5, C5-6 and C6-7. Upper chest: Negative. Other: Degenerative changes are seen involving posterior facet joints bilaterally. IMPRESSION: 1. Mild diffuse cortical atrophy. Mild chronic ischemic white matter disease. No acute intracranial abnormality seen. 2. Multilevel degenerative disc disease. No acute abnormality seen in the cervical spine. Electronically Signed   By: Marijo Conception M.D.   On: 11/21/2019 12:36   CT Cervical Spine Wo Contrast  Result Date: 11/21/2019 CLINICAL DATA:  Unwitnessed fall. EXAM: CT HEAD WITHOUT CONTRAST CT CERVICAL SPINE WITHOUT CONTRAST TECHNIQUE: Multidetector CT imaging of the head and cervical spine was performed following the standard protocol without intravenous contrast. Multiplanar CT image reconstructions of the cervical spine were also generated. COMPARISON:  October 25, 2010. FINDINGS: CT HEAD FINDINGS Brain: Mild diffuse cortical atrophy is noted. Mild chronic ischemic white matter disease is noted. No mass effect or midline shift is noted.  Ventricular size is within normal limits. There is no evidence of mass lesion, hemorrhage or acute infarction. Vascular: No hyperdense vessel or unexpected calcification. Skull: Normal.  Negative for fracture or focal lesion. Sinuses/Orbits: No acute finding. Other: None. CT CERVICAL SPINE FINDINGS Alignment: Normal. Skull base and vertebrae: No acute fracture. No primary bone lesion or focal pathologic process. Soft tissues and spinal canal: No prevertebral fluid or swelling. No visible canal hematoma. Disc levels: Mild degenerative disc disease is noted at C4-5, C5-6 and C6-7. Upper chest: Negative. Other: Degenerative changes are seen involving posterior facet joints bilaterally. IMPRESSION: 1. Mild diffuse cortical atrophy. Mild chronic ischemic white matter disease. No acute intracranial abnormality seen. 2. Multilevel degenerative disc disease. No acute abnormality seen in the cervical spine. Electronically Signed   By: Lupita Raider M.D.   On: 11/21/2019 12:36   DG Chest Portable 1 View  Result Date: 11/21/2019 CLINICAL DATA:  Preop. EXAM: PORTABLE CHEST 1 VIEW COMPARISON:  May 29, 2011. FINDINGS: Mild cardiomegaly is noted. No pneumothorax or pleural effusion is noted. Status post coronary bypass graft. Both lungs are clear. The visualized skeletal structures are unremarkable. IMPRESSION: No active disease. Electronically Signed   By: Lupita Raider M.D.   On: 11/21/2019 13:52   DG Hip Unilat W or Wo Pelvis 2-3 Views Left  Result Date: 11/21/2019 CLINICAL DATA:  86 year old who fell and complains of LEFT hip and LEFT UPPER leg pain. Initial encounter. EXAM: DG HIP (WITH OR WITHOUT PELVIS) 2-3V LEFT COMPARISON:  None. FINDINGS: Acute subcapital or basicervical LEFT femoral neck fracture. LEFT hip joint anatomically aligned with well-preserved joint space for age. Bone mineral density well preserved for age. Included AP pelvis demonstrates no fractures elsewhere. Well preserved joint space in the  contralateral RIGHT hip. Sacroiliac joints anatomically aligned without diastasis, demonstrating only mild degenerative changes. Symphysis pubis anatomically aligned without diastasis or degenerative changes. IMPRESSION: Acute subcapital or basicervical LEFT femoral neck fracture. Electronically Signed   By: Hulan Saas M.D.   On: 11/21/2019 12:28   DG Femur Min 2 Views Left  Result Date: 11/21/2019 CLINICAL DATA:  86 year old who fell and complains of LEFT hip and LEFT UPPER leg pain. Initial encounter. EXAM: LEFT FEMUR 2 VIEWS COMPARISON:  None. FINDINGS: Subcapital/basicervical LEFT femoral neck fracture as detailed on the report of the concurrent LEFT hip images. No fractures elsewhere involving the femur. Well-preserved bone mineral density. Femoropopliteal atherosclerosis. IMPRESSION: Subcapital/basicervical LEFT femoral neck fracture as detailed on the report of the concurrent LEFT hip images. No fractures elsewhere involving the femur. Electronically Signed   By: Hulan Saas M.D.   On: 11/21/2019 12:32    Procedures Procedures (including critical care time)  Medications Ordered in ED Medications  fentaNYL (SUBLIMAZE) injection 50 mcg (50 mcg Intravenous Given 11/21/19 1118)  ondansetron (ZOFRAN) injection 4 mg (4 mg Intravenous Given 11/21/19 1118)  sodium chloride 0.9 % bolus 1,000 mL (0 mLs Intravenous Stopped 11/21/19 1439)  LORazepam (ATIVAN) injection 0.5 mg (0.5 mg Intravenous Given 11/21/19 1437)    ED Course  I have reviewed the triage vital signs and the nursing notes.  Pertinent labs & imaging results that were available during my care of the patient were reviewed by me and considered in my medical decision making (see chart for details).  Clinical Course as of Nov 20 1598  Sun Nov 21, 2019  1327 Spoke with Dr. Magnus Ivan, orthopedic surgeon. Requests medicine admit. States they will try to plan for surgery tomorrow afternoon. NPO after midnight.   [SJ]  1415 Patient noted  to be a bit agitated.  He is redirectable, but is noted to be fidgeting with his  clothing and EKG wires.  He is also trying to get out of bed.  Vital signs do not suggest acute physiologic distress.  Patient's daughter states this is typical for the patient when he sits in one place for an extended period of time.   [SJ]  1459 Spoke to Dr. Pola Corn, hospitalist.  Agrees to admit the patient.   [SJ]    Clinical Course User Index [SJ] Joseh Sjogren, Hillard Danker, PA-C   MDM Rules/Calculators/A&P                      Patient presents with left hip pain following a fall yesterday.  No fever, tachypnea.  Maintains excellent SPO2 on room air. Patient has evidence of left femoral neck fracture on x-ray.  Similar findings on femur x-ray.  No acute abnormalities on other imaging studies. Bacteria and leukocytes on urinalysis.  Urine culture pending. Slight elevation in creatinine and BUN.  Addressed with IV fluids. Elevation in troponin with no definite reason, noted to be declining.  No evidence of ischemia on EKGs. Patient's intermittent tachycardia may be due to his intermittent agitation from his dementia.  His daughter also suggested it may be due to the stress of being here in the emergency department since he "really does not like being in the hospital."    Findings and plan of care discussed with Kristine Royal, MD. Dr. Rodena Medin personally evaluated and examined this patient.  Final Clinical Impression(s) / ED Diagnoses Final diagnoses:  Closed fracture of left hip, initial encounter [facility])    Rx / DC Orders ED Discharge Orders    None       Concepcion Living 11/21/19 1600    Wynetta Fines, MD 11/26/19 1415

## 2019-11-21 NOTE — Consult Note (Signed)
Reason for Consult: Left hip fracture Referring Physician: Omelia Blackwater. is an 86 y.o. male.  HPI:  Patient is a 86 year old male with Alzheimer's, hypertension, history of CABG 2001 and mild to moderate aortic stenosis.  Patient's daughter at bedside.  Patient's history both medical and acute fall given by daughter.  Patient lives at home with his adult son and evidently was to be using a cane to ambulate in the home yesterday when he fell.  This was an unwitnessed fall.  Is complaining of hip pain but due to the fact that it was late in the day and he decided to wait to bring him to the ER and due to the fact that he continued to have pain today the brought him to the [facility], ER where he was found to have a left hip femoral neck fracture. Patient's daughter states that he was in overall good health until this past summer when he developed an UTI.  He apparently had some decrease in overall activity after this UTI but began to increase his activities in the fall until he had another UTI.  She reports he takes no medications.  Reports he has had no recent fevers, chills or acute illnesses.  Past Medical History:  Diagnosis Date  . Aortic stenosis    Mild to moderate per echo in 2008  . Asymptomatic PVCs   . Hyperlipidemia   . Hypertension   . IHD (ischemic heart disease)    s/p CABG in 2001  . LV dysfunction    EF 50% per nuclear in 2008  . Memory loss   . PAC (premature atrial contraction)     Past Surgical History:  Procedure Laterality Date  . CARDIAC CATHETERIZATION  04/10/2000   EF 44-45%  . CARDIOVASCULAR STRESS TEST  08/24/2007   EF 50%  . CORONARY ARTERY BYPASS GRAFT  2001  . TONSILLECTOMY AND ADENOIDECTOMY    . US ECHOCARDIOGRAPHY  04/29/2007   EF 55-60%    Family History  Problem Relation Age of Onset  . Stroke Mother   . Heart failure Father     Social History:  reports that he has never smoked. He has never used smokeless tobacco. He reports  that he does not drink alcohol or use drugs.  Allergies: No Known Allergies  Medications: I have reviewed the patient's current medications.  Results for orders placed or performed during the hospital encounter of 11/21/19 (from the past 48 hour(s))  Urinalysis, Routine w reflex microscopic     Status: Abnormal   Collection Time: 11/21/19 11:15 AM  Result Value Ref Range   Color, Urine AMBER (A) YELLOW    Comment: BIOCHEMICALS MAY BE AFFECTED BY COLOR   APPearance CLEAR CLEAR   Specific Gravity, Urine 1.020 1.005 - 1.030   pH 5.0 5.0 - 8.0   Glucose, UA NEGATIVE NEGATIVE mg/dL   Hgb urine dipstick SMALL (A) NEGATIVE   Bilirubin Urine NEGATIVE NEGATIVE   Ketones, ur NEGATIVE NEGATIVE mg/dL   Protein, ur NEGATIVE NEGATIVE mg/dL   Nitrite NEGATIVE NEGATIVE   Leukocytes,Ua TRACE (A) NEGATIVE   RBC / HPF 0-5 0 - 5 RBC/hpf   WBC, UA 6-10 0 - 5 WBC/hpf   Bacteria, UA MANY (A) NONE SEEN   Mucus PRESENT     Comment: Performed at [facility], [address], [place], [place] [address]  Basic metabolic panel     Status: Abnormal   Collection Time: 11/21/19 11:23 AM  Result Value Ref Range   Sodium 137 135 - 145 mmol/L   Potassium 3.8 3.5 - 5.1 mmol/L   Chloride 105 98 - 111 mmol/L   CO2 26 22 - 32 mmol/L   Glucose, Bld 144 (H) 70 - 99 mg/dL   BUN 37 (H) 8 - 23 mg/dL   Creatinine, Ser 4.13 (H) 0.61 - 1.24 mg/dL   Calcium 9.3 8.9 - 24.4 mg/dL   GFR calc non Af Amer 40 (L) >60 mL/min   GFR calc Af Amer 47 (L) >60 mL/min   Anion gap 6 5 - 15    Comment: Performed at [facility], [address], [place], [place] [address]  CBC with Differential     Status: Abnormal   Collection Time: 11/21/19 11:23 AM  Result Value Ref Range   WBC 11.1 (H) 4.0 - 10.5 K/uL   RBC 5.04 4.22 - 5.81 MIL/uL   Hemoglobin 15.3 13.0 - 17.0 g/dL   HCT 25.3 66.4 - 40.3 %   MCV 94.0 80.0 - 100.0 fL   MCH 30.4 26.0 - 34.0 pg   MCHC 32.3 30.0 - 36.0 g/dL   RDW 47.4 25.9 -  56.3 %   Platelets 190 150 - 400 K/uL   nRBC 0.0 0.0 - 0.2 %   Neutrophils Relative % 87 %   Neutro Abs 9.6 (H) 1.7 - 7.7 K/uL   Lymphocytes Relative 8 %   Lymphs Abs 0.8 0.7 - 4.0 K/uL   Monocytes Relative 5 %   Monocytes Absolute 0.5 0.1 - 1.0 K/uL   Eosinophils Relative 0 %   Eosinophils Absolute 0.0 0.0 - 0.5 K/uL   Basophils Relative 0 %   Basophils Absolute 0.1 0.0 - 0.1 K/uL   Immature Granulocytes 0 %   Abs Immature Granulocytes 0.05 0.00 - 0.07 K/uL    Comment: Performed at [facility], [address], [place], [place] [address]  CK     Status: None   Collection Time: 11/21/19 11:23 AM  Result Value Ref Range   Total CK 49 49 - 397 U/L    Comment: Performed at [facility], [address] [place], [place] [address]  Troponin I (High Sensitivity)     Status: Abnormal   Collection Time: 11/21/19 11:23 AM  Result Value Ref Range   Troponin I (High Sensitivity) 204 (HH) <18 ng/L    Comment: CRITICAL RESULT CALLED TO, READ BACK BY AND VERIFIED WITH: GROVES,R RN @1244  ON 11/21/2019 JACKSON,K (NOTE) Elevated high sensitivity troponin I (hsTnI) values and significant  changes across serial measurements may suggest ACS but many other  chronic and acute conditions are known to elevate hsTnI results.  Refer to the Links section for chest pain algorithms and additional  guidance. Performed at [facility], [address] [place], [place]   Troponin I (High Sensitivity)     Status: Abnormal   Collection Time: 11/21/19  1:15 PM  Result Value Ref Range   Troponin I (High Sensitivity) 172 (HH) <18 ng/L    Comment: CRITICAL VALUE NOTED.  VALUE IS CONSISTENT WITH PREVIOUSLY REPORTED AND CALLED VALUE. (NOTE) Elevated high sensitivity troponin I (hsTnI) values and significant  changes across serial measurements may suggest ACS but many other  chronic and acute conditions are known to elevate hsTnI results.  Refer to  the Links section for chest pain algorithms and additional  guidance. Performed at [facility], [address]  [place], [place] [address]     DG Knee 1-2 Views Left  Result Date: 11/21/2019 CLINICAL DATA:  86 year old who fell and complains of LEFT hip and LEFT UPPER leg pain. Initial encounter. EXAM: LEFT KNEE - 1-2 VIEW COMPARISON:  None. FINDINGS: No evidence of acute fracture or dislocation. Moderate MEDIAL and LATERAL compartment joint space narrowing. Patellofemoral joint space well-preserved. Enthesopathic spurring at the insertion of the quadriceps tendon on the SUPERIOR patella and at the insertion of the patellar tendon on the INFERIOR patella. No visible joint effusion. Femoropopliteal atherosclerosis. IMPRESSION: 1. No acute osseous abnormality. 2. Moderate MEDIAL and LATERAL compartment osteoarthritis. Electronically Signed   By: Evangeline Dakin M.D.   On: 11/21/2019 12:30   CT Head Wo Contrast  Result Date: 11/21/2019 CLINICAL DATA:  Unwitnessed fall. EXAM: CT HEAD WITHOUT CONTRAST CT CERVICAL SPINE WITHOUT CONTRAST TECHNIQUE: Multidetector CT imaging of the head and cervical spine was performed following the standard protocol without intravenous contrast. Multiplanar CT image reconstructions of the cervical spine were also generated. COMPARISON:  October 25, 2010. FINDINGS: CT HEAD FINDINGS Brain: Mild diffuse cortical atrophy is noted. Mild chronic ischemic white matter disease is noted. No mass effect or midline shift is noted. Ventricular size is within normal limits. There is no evidence of mass lesion, hemorrhage or acute infarction. Vascular: No hyperdense vessel or unexpected calcification. Skull: Normal. Negative for fracture or focal lesion. Sinuses/Orbits: No acute finding. Other: None. CT CERVICAL SPINE FINDINGS Alignment: Normal. Skull base and vertebrae: No acute fracture. No primary bone lesion or focal pathologic process. Soft tissues and spinal canal:  No prevertebral fluid or swelling. No visible canal hematoma. Disc levels: Mild degenerative disc disease is noted at C4-5, C5-6 and C6-7. Upper chest: Negative. Other: Degenerative changes are seen involving posterior facet joints bilaterally. IMPRESSION: 1. Mild diffuse cortical atrophy. Mild chronic ischemic white matter disease. No acute intracranial abnormality seen. 2. Multilevel degenerative disc disease. No acute abnormality seen in the cervical spine. Electronically Signed   By: Marijo Conception M.D.   On: 11/21/2019 12:36   CT Cervical Spine Wo Contrast  Result Date: 11/21/2019 CLINICAL DATA:  Unwitnessed fall. EXAM: CT HEAD WITHOUT CONTRAST CT CERVICAL SPINE WITHOUT CONTRAST TECHNIQUE: Multidetector CT imaging of the head and cervical spine was performed following the standard protocol without intravenous contrast. Multiplanar CT image reconstructions of the cervical spine were also generated. COMPARISON:  October 25, 2010. FINDINGS: CT HEAD FINDINGS Brain: Mild diffuse cortical atrophy is noted. Mild chronic ischemic white matter disease is noted. No mass effect or midline shift is noted. Ventricular size is within normal limits. There is no evidence of mass lesion, hemorrhage or acute infarction. Vascular: No hyperdense vessel or unexpected calcification. Skull: Normal. Negative for fracture or focal lesion. Sinuses/Orbits: No acute finding. Other: None. CT CERVICAL SPINE FINDINGS Alignment: Normal. Skull base and vertebrae: No acute fracture. No primary bone lesion or focal pathologic process. Soft tissues and spinal canal: No prevertebral fluid or swelling. No visible canal hematoma. Disc levels: Mild degenerative disc disease is noted at C4-5, C5-6 and C6-7. Upper chest: Negative. Other: Degenerative changes are seen involving posterior facet joints bilaterally. IMPRESSION: 1. Mild diffuse cortical atrophy. Mild chronic ischemic white matter disease. No acute intracranial abnormality seen. 2.  Multilevel degenerative disc disease. No acute abnormality seen in the cervical spine. Electronically Signed   By: Marijo Conception M.D.   On: 11/21/2019 12:36   DG Chest Portable 1 View  Result Date: 11/21/2019 CLINICAL DATA:  Preop. EXAM: PORTABLE CHEST 1 VIEW COMPARISON:  May 29, 2011. FINDINGS: Mild cardiomegaly is noted. No pneumothorax or pleural effusion is noted. Status post coronary bypass graft. Both lungs are clear. The visualized skeletal structures are unremarkable. IMPRESSION: No active disease. Electronically Signed   By: Lupita Raider M.D.   On: 11/21/2019 13:52   DG Hip Unilat W or Wo Pelvis 2-3 Views Left  Result Date: 11/21/2019 CLINICAL DATA:  86 year old who fell and complains of LEFT hip and LEFT UPPER leg pain. Initial encounter. EXAM: DG HIP (WITH OR WITHOUT PELVIS) 2-3V LEFT COMPARISON:  None. FINDINGS: Acute subcapital or basicervical LEFT femoral neck fracture. LEFT hip joint anatomically aligned with well-preserved joint space for age. Bone mineral density well preserved for age. Included AP pelvis demonstrates no fractures elsewhere. Well preserved joint space in the contralateral RIGHT hip. Sacroiliac joints anatomically aligned without diastasis, demonstrating only mild degenerative changes. Symphysis pubis anatomically aligned without diastasis or degenerative changes. IMPRESSION: Acute subcapital or basicervical LEFT femoral neck fracture. Electronically Signed   By: Hulan Saas M.D.   On: 11/21/2019 12:28   DG Femur Min 2 Views Left  Result Date: 11/21/2019 CLINICAL DATA:  86 year old who fell and complains of LEFT hip and LEFT UPPER leg pain. Initial encounter. EXAM: LEFT FEMUR 2 VIEWS COMPARISON:  None. FINDINGS: Subcapital/basicervical LEFT femoral neck fracture as detailed on the report of the concurrent LEFT hip images. No fractures elsewhere involving the femur. Well-preserved bone mineral density. Femoropopliteal atherosclerosis. IMPRESSION:  Subcapital/basicervical LEFT femoral neck fracture as detailed on the report of the concurrent LEFT hip images. No fractures elsewhere involving the femur. Electronically Signed   By: Hulan Saas M.D.   On: 11/21/2019 12:32    Review of Systems  Reason unable to perform ROS: Dementia.   Blood pressure 105/90, pulse (!) 47, temperature 98.2 F (36.8 C), temperature source Oral, resp. rate 10, SpO2 100 %.   Physical Exam  Constitutional: He appears well-developed and well-nourished. No distress.  HENT:  Head: Normocephalic and atraumatic.  Respiratory: Effort normal.  Skin: Skin is warm and dry.  Lower extremities:Right hip good range of motion without obvious pain. No attempted range of motion left hip. Bilateral knees , lower legs feet and ankles with out gross deformities. Dorsal pedal pulses intact. Calves supple bilateral.   Assessment/Plan: Left hip femoral neck fracture. Recommend left total hip vs hemiarthroplasty. Explained risk/ benefits of surgery to patient's daughter. Discussed operative procedure and post op protocol. She is agreeable to surgery. Plan surgery tomorrow afternoon.  NPO after Midnight  Strict bed rest.    Senita Corredor 11/21/2019, 3:59 PM

## 2019-11-21 NOTE — ED Notes (Signed)
ED TO INPATIENT HANDOFF REPORT  ED Nurse Name and Phone #: [phone]  S Name/Age/Gender Frank Gates. 86 y.o. male Room/Bed: WA20/WA20  Code Status   Code Status: Not on file  Home/SNF/Other Home Patient oriented to: self, place, time and situation Is this baseline? Yes   Triage Complete: Triage complete  Chief Complaint Hip fracture (HCC) [S72.009A]  Triage Note Pt presents with c/o fall that occurred last night with associated left hip pain. Pt does have dementia. Pt was able to put minimal weight on the leg after the fall.    Allergies No Known Allergies  Level of Care/Admitting Diagnosis ED Disposition    ED Disposition Condition Comment   Admit  Hospital Area: [facility] [[id number]]  Level of Care: Telemetry [5]  Admit to tele based on following criteria: Monitor for Ischemic changes  Covid Evaluation: Asymptomatic Screening Protocol (No Symptoms)  Diagnosis: Hip fracture [facility]) [[id number]]  Admitting Physician: Lorin Glass [[id number]]  Attending Physician: Lorin Glass [[id number]]  Estimated length of stay: past midnight tomorrow  Certification:: I certify this patient will need inpatient services for at least 2 midnights       B Medical/Surgery History Past Medical History:  Diagnosis Date  . Aortic stenosis    Mild to moderate per echo in 2008  . Asymptomatic PVCs   . Hyperlipidemia   . Hypertension   . IHD (ischemic heart disease)    s/p CABG in 2001  . LV dysfunction    EF 50% per nuclear in 2008  . Memory loss   . PAC (premature atrial contraction)    Past Surgical History:  Procedure Laterality Date  . CARDIAC CATHETERIZATION  04/10/2000   EF 44-45%  . CARDIOVASCULAR STRESS TEST  08/24/2007   EF 50%  . CORONARY ARTERY BYPASS GRAFT  2001  . TONSILLECTOMY AND ADENOIDECTOMY    . US ECHOCARDIOGRAPHY  04/29/2007   EF 55-60%     A IV Location/Drains/Wounds Patient Lines/Drains/Airways Status   Active Line/Drains/Airways     Name:   Placement date:   Placement time:   Site:   Days:   Peripheral IV 11/21/19 Left Antecubital   11/21/19    1117    Antecubital   less than 1   External Urinary Catheter   11/21/19    1134    --   less than 1          Intake/Output Last 24 hours No intake or output data in the 24 hours ending 11/21/19 1706  Labs/Imaging Results for orders placed or performed during the hospital encounter of 11/21/19 (from the past 48 hour(s))  Urinalysis, Routine w reflex microscopic     Status: Abnormal   Collection Time: 11/21/19 11:15 AM  Result Value Ref Range   Color, Urine AMBER (A) YELLOW    Comment: BIOCHEMICALS MAY BE AFFECTED BY COLOR   APPearance CLEAR CLEAR   Specific Gravity, Urine 1.020 1.005 - 1.030   pH 5.0 5.0 - 8.0   Glucose, UA NEGATIVE NEGATIVE mg/dL   Hgb urine dipstick SMALL (A) NEGATIVE   Bilirubin Urine NEGATIVE NEGATIVE   Ketones, ur NEGATIVE NEGATIVE mg/dL   Protein, ur NEGATIVE NEGATIVE mg/dL   Nitrite NEGATIVE NEGATIVE   Leukocytes,Ua TRACE (A) NEGATIVE   RBC / HPF 0-5 0 - 5 RBC/hpf   WBC, UA 6-10 0 - 5 WBC/hpf   Bacteria, UA MANY (A) NONE SEEN   Mucus PRESENT     Comment: Performed at [employer] [facility], [address], [place], [place] [address]  Basic metabolic panel     Status: Abnormal   Collection Time: 11/21/19 11:23 AM  Result Value Ref Range   Sodium 137 135 - 145 mmol/L   Potassium 3.8 3.5 - 5.1 mmol/L   Chloride 105 98 - 111 mmol/L   CO2 26 22 - 32 mmol/L   Glucose, Bld 144 (H) 70 - 99 mg/dL   BUN 37 (H) 8 - 23 mg/dL   Creatinine, Ser 8.93 (H) 0.61 - 1.24 mg/dL   Calcium 9.3 8.9 - 81.0 mg/dL   GFR calc non Af Amer 40 (L) >60 mL/min   GFR calc Af Amer 47 (L) >60 mL/min   Anion gap 6 5 - 15    Comment: Performed at [facility], [address], [place], [place] [address]  CBC with Differential     Status: Abnormal   Collection Time: 11/21/19 11:23 AM  Result Value Ref Range   WBC 11.1 (H) 4.0 - 10.5 K/uL    RBC 5.04 4.22 - 5.81 MIL/uL   Hemoglobin 15.3 13.0 - 17.0 g/dL   HCT 25.8 52.7 - 78.2 %   MCV 94.0 80.0 - 100.0 fL   MCH 30.4 26.0 - 34.0 pg   MCHC 32.3 30.0 - 36.0 g/dL   RDW 42.3 53.6 - 14.4 %   Platelets 190 150 - 400 K/uL   nRBC 0.0 0.0 - 0.2 %   Neutrophils Relative % 87 %   Neutro Abs 9.6 (H) 1.7 - 7.7 K/uL   Lymphocytes Relative 8 %   Lymphs Abs 0.8 0.7 - 4.0 K/uL   Monocytes Relative 5 %   Monocytes Absolute 0.5 0.1 - 1.0 K/uL   Eosinophils Relative 0 %   Eosinophils Absolute 0.0 0.0 - 0.5 K/uL   Basophils Relative 0 %   Basophils Absolute 0.1 0.0 - 0.1 K/uL   Immature Granulocytes 0 %   Abs Immature Granulocytes 0.05 0.00 - 0.07 K/uL    Comment: Performed at [facility], [address], [place], [place] [address]  CK     Status: None   Collection Time: 11/21/19 11:23 AM  Result Value Ref Range   Total CK 49 49 - 397 U/L    Comment: Performed at [facility], [address], [place], [place] [address]  Troponin I (High Sensitivity)     Status: Abnormal   Collection Time: 11/21/19 11:23 AM  Result Value Ref Range   Troponin I (High Sensitivity) 204 (HH) <18 ng/L    Comment: CRITICAL RESULT CALLED TO, READ BACK BY AND VERIFIED WITH: GROVES,R RN @1244  ON 11/21/2019 JACKSON,K (NOTE) Elevated high sensitivity troponin I (hsTnI) values and significant  changes across serial measurements may suggest ACS but many other  chronic and acute conditions are known to elevate hsTnI results.  Refer to the Links section for chest pain algorithms and additional  guidance. Performed at [facility], [address] [place], [place]   Troponin I (High Sensitivity)     Status: Abnormal   Collection Time: 11/21/19  1:15 PM  Result Value Ref Range   Troponin I (High Sensitivity) 172 (HH) <18 ng/L    Comment: CRITICAL VALUE NOTED.  VALUE IS CONSISTENT WITH PREVIOUSLY REPORTED AND CALLED VALUE. (NOTE) Elevated high  sensitivity troponin I (hsTnI) values and significant  changes across serial measurements may suggest ACS but many other  chronic and acute conditions are known to  elevate hsTnI results.  Refer to the Links section for chest pain algorithms and additional  guidance. Performed at [facility], [address], [place], [place] [address]    DG Knee 1-2 Views Left  Result Date: 11/21/2019 CLINICAL DATA:  86 year old who fell and complains of LEFT hip and LEFT UPPER leg pain. Initial encounter. EXAM: LEFT KNEE - 1-2 VIEW COMPARISON:  None. FINDINGS: No evidence of acute fracture or dislocation. Moderate MEDIAL and LATERAL compartment joint space narrowing. Patellofemoral joint space well-preserved. Enthesopathic spurring at the insertion of the quadriceps tendon on the SUPERIOR patella and at the insertion of the patellar tendon on the INFERIOR patella. No visible joint effusion. Femoropopliteal atherosclerosis. IMPRESSION: 1. No acute osseous abnormality. 2. Moderate MEDIAL and LATERAL compartment osteoarthritis. Electronically Signed   By: Hulan Saas M.D.   On: 11/21/2019 12:30   CT Head Wo Contrast  Result Date: 11/21/2019 CLINICAL DATA:  Unwitnessed fall. EXAM: CT HEAD WITHOUT CONTRAST CT CERVICAL SPINE WITHOUT CONTRAST TECHNIQUE: Multidetector CT imaging of the head and cervical spine was performed following the standard protocol without intravenous contrast. Multiplanar CT image reconstructions of the cervical spine were also generated. COMPARISON:  October 25, 2010. FINDINGS: CT HEAD FINDINGS Brain: Mild diffuse cortical atrophy is noted. Mild chronic ischemic white matter disease is noted. No mass effect or midline shift is noted. Ventricular size is within normal limits. There is no evidence of mass lesion, hemorrhage or acute infarction. Vascular: No hyperdense vessel or unexpected calcification. Skull: Normal. Negative for fracture or focal lesion. Sinuses/Orbits: No acute  finding. Other: None. CT CERVICAL SPINE FINDINGS Alignment: Normal. Skull base and vertebrae: No acute fracture. No primary bone lesion or focal pathologic process. Soft tissues and spinal canal: No prevertebral fluid or swelling. No visible canal hematoma. Disc levels: Mild degenerative disc disease is noted at C4-5, C5-6 and C6-7. Upper chest: Negative. Other: Degenerative changes are seen involving posterior facet joints bilaterally. IMPRESSION: 1. Mild diffuse cortical atrophy. Mild chronic ischemic white matter disease. No acute intracranial abnormality seen. 2. Multilevel degenerative disc disease. No acute abnormality seen in the cervical spine. Electronically Signed   By: Lupita Raider M.D.   On: 11/21/2019 12:36   CT Cervical Spine Wo Contrast  Result Date: 11/21/2019 CLINICAL DATA:  Unwitnessed fall. EXAM: CT HEAD WITHOUT CONTRAST CT CERVICAL SPINE WITHOUT CONTRAST TECHNIQUE: Multidetector CT imaging of the head and cervical spine was performed following the standard protocol without intravenous contrast. Multiplanar CT image reconstructions of the cervical spine were also generated. COMPARISON:  October 25, 2010. FINDINGS: CT HEAD FINDINGS Brain: Mild diffuse cortical atrophy is noted. Mild chronic ischemic white matter disease is noted. No mass effect or midline shift is noted. Ventricular size is within normal limits. There is no evidence of mass lesion, hemorrhage or acute infarction. Vascular: No hyperdense vessel or unexpected calcification. Skull: Normal. Negative for fracture or focal lesion. Sinuses/Orbits: No acute finding. Other: None. CT CERVICAL SPINE FINDINGS Alignment: Normal. Skull base and vertebrae: No acute fracture. No primary bone lesion or focal pathologic process. Soft tissues and spinal canal: No prevertebral fluid or swelling. No visible canal hematoma. Disc levels: Mild degenerative disc disease is noted at C4-5, C5-6 and C6-7. Upper chest: Negative. Other: Degenerative  changes are seen involving posterior facet joints bilaterally. IMPRESSION: 1. Mild diffuse cortical atrophy. Mild chronic ischemic white matter disease. No acute intracranial abnormality seen. 2. Multilevel degenerative disc disease. No acute abnormality seen in the cervical spine. Electronically Signed  By: Lupita Raider M.D.   On: 11/21/2019 12:36   DG Chest Portable 1 View  Result Date: 11/21/2019 CLINICAL DATA:  Preop. EXAM: PORTABLE CHEST 1 VIEW COMPARISON:  May 29, 2011. FINDINGS: Mild cardiomegaly is noted. No pneumothorax or pleural effusion is noted. Status post coronary bypass graft. Both lungs are clear. The visualized skeletal structures are unremarkable. IMPRESSION: No active disease. Electronically Signed   By: Lupita Raider M.D.   On: 11/21/2019 13:52   DG Hip Unilat W or Wo Pelvis 2-3 Views Left  Result Date: 11/21/2019 CLINICAL DATA:  86 year old who fell and complains of LEFT hip and LEFT UPPER leg pain. Initial encounter. EXAM: DG HIP (WITH OR WITHOUT PELVIS) 2-3V LEFT COMPARISON:  None. FINDINGS: Acute subcapital or basicervical LEFT femoral neck fracture. LEFT hip joint anatomically aligned with well-preserved joint space for age. Bone mineral density well preserved for age. Included AP pelvis demonstrates no fractures elsewhere. Well preserved joint space in the contralateral RIGHT hip. Sacroiliac joints anatomically aligned without diastasis, demonstrating only mild degenerative changes. Symphysis pubis anatomically aligned without diastasis or degenerative changes. IMPRESSION: Acute subcapital or basicervical LEFT femoral neck fracture. Electronically Signed   By: Hulan Saas M.D.   On: 11/21/2019 12:28   DG Femur Min 2 Views Left  Result Date: 11/21/2019 CLINICAL DATA:  86 year old who fell and complains of LEFT hip and LEFT UPPER leg pain. Initial encounter. EXAM: LEFT FEMUR 2 VIEWS COMPARISON:  None. FINDINGS: Subcapital/basicervical LEFT femoral neck fracture as  detailed on the report of the concurrent LEFT hip images. No fractures elsewhere involving the femur. Well-preserved bone mineral density. Femoropopliteal atherosclerosis. IMPRESSION: Subcapital/basicervical LEFT femoral neck fracture as detailed on the report of the concurrent LEFT hip images. No fractures elsewhere involving the femur. Electronically Signed   By: Hulan Saas M.D.   On: 11/21/2019 12:32    Pending Labs Unresulted Labs (From admission, onward)    Start     Ordered   11/21/19 1239  Urine culture  ONCE - STAT,   STAT     11/21/19 1238   11/21/19 1104  SARS CORONAVIRUS 2 (TAT 6-24 HRS) Nasopharyngeal Nasopharyngeal Swab  (Tier 3 (TAT 6-24 hrs))  Once,   STAT    Question Answer Comment  Is this test for diagnosis or screening Screening   Symptomatic for COVID-19 as defined by CDC No   Hospitalized for COVID-19 No   Admitted to ICU for COVID-19 No   Previously tested for COVID-19 No   Resident in a congregate (group) care setting No   Employed in healthcare setting No      11/21/19 1103   Signed and Held  Basic metabolic panel  Daily,   R     Signed and Held   Signed and Held  CBC  Daily,   R     Signed and Held          Vitals/Pain Today's Vitals   11/21/19 1615 11/21/19 1630 11/21/19 1645 11/21/19 1700  BP:  109/69  103/76  Pulse: (!) 108     Resp: (!) 26 (!) 0 (!) 24 19  Temp:      TempSrc:      SpO2: 94%       Isolation Precautions No active isolations  Medications Medications  haloperidol lactate (HALDOL) injection 2 mg (has no administration in time range)  fentaNYL (SUBLIMAZE) injection 50 mcg (50 mcg Intravenous Given 11/21/19 1118)  ondansetron (ZOFRAN) injection 4 mg (4 mg Intravenous  Given 11/21/19 1118)  sodium chloride 0.9 % bolus 1,000 mL (0 mLs Intravenous Stopped 11/21/19 1439)  LORazepam (ATIVAN) injection 0.5 mg (0.5 mg Intravenous Given 11/21/19 1437)    Mobility walks High fall risk   Focused Assessments left hip fracture     R Recommendations: See Admitting Provider Note  Report given to:   Additional Notes: N/A

## 2019-11-21 NOTE — ED Triage Notes (Signed)
Pt presents with c/o fall that occurred last night with associated left hip pain. Pt does have dementia. Pt was able to put minimal weight on the leg after the fall.

## 2019-11-21 NOTE — Progress Notes (Signed)
Patient ID: Frank Gates., male   DOB: 19-Aug-1926, 86 y.o.   MRN: [id number] I was called in consultation about this patient.  I have been able to at least review his x-rays.  It looks like he has a femoral neck fracture of the left hip.  Unfortunately I am still over at [facility] with multiple surgeries from being on-call.  We will be unable to perform surgery today on this patient is left hip.Marland Kitchen  He needs medical clearance for proceeding to surgery tomorrow afternoon for a left hip replacement.  We will get by to see him at some point today.

## 2019-11-22 ENCOUNTER — Inpatient Hospital Stay (HOSPITAL_COMMUNITY): Payer: Medicare Other | Admitting: Anesthesiology

## 2019-11-22 ENCOUNTER — Inpatient Hospital Stay (HOSPITAL_COMMUNITY): Payer: Medicare Other

## 2019-11-22 ENCOUNTER — Encounter (HOSPITAL_COMMUNITY): Payer: Self-pay | Admitting: Internal Medicine

## 2019-11-22 ENCOUNTER — Encounter (HOSPITAL_COMMUNITY)
Admission: EM | Disposition: A | Discharge status: Home Health | Payer: Self-pay | Source: Home / Self Care | Attending: Internal Medicine

## 2019-11-22 DIAGNOSIS — I5031 Acute diastolic (congestive) heart failure: Secondary | ICD-10-CM

## 2019-11-22 DIAGNOSIS — E7800 Pure hypercholesterolemia, unspecified: Secondary | ICD-10-CM

## 2019-11-22 DIAGNOSIS — I25119 Atherosclerotic heart disease of native coronary artery with unspecified angina pectoris: Secondary | ICD-10-CM

## 2019-11-22 DIAGNOSIS — I10 Essential (primary) hypertension: Secondary | ICD-10-CM

## 2019-11-22 DIAGNOSIS — S72002A Fracture of unspecified part of neck of left femur, initial encounter for closed fracture: Principal | ICD-10-CM

## 2019-11-22 HISTORY — PX: TOTAL HIP ARTHROPLASTY: SHX124

## 2019-11-22 LAB — BASIC METABOLIC PANEL
Anion gap: 8 (ref 5–15)
BUN: 34 mg/dL — ABNORMAL HIGH (ref 8–23)
CO2: 23 mmol/L (ref 22–32)
Calcium: 9.4 mg/dL (ref 8.9–10.3)
Chloride: 110 mmol/L (ref 98–111)
Creatinine, Ser: 1.41 mg/dL — ABNORMAL HIGH (ref 0.61–1.24)
GFR calc Af Amer: 50 mL/min — ABNORMAL LOW (ref >60)
GFR calc non Af Amer: 43 mL/min — ABNORMAL LOW (ref >60)
Glucose, Bld: 123 mg/dL — ABNORMAL HIGH (ref 70–99)
Potassium: 3.8 mmol/L (ref 3.5–5.1)
Sodium: 141 mmol/L (ref 135–145)

## 2019-11-22 LAB — CBC
HCT: 43.8 % (ref 39.0–52.0)
Hemoglobin: 13.9 g/dL (ref 13.0–17.0)
MCH: 30.2 pg (ref 26.0–34.0)
MCHC: 31.7 g/dL (ref 30.0–36.0)
MCV: 95 fL (ref 80.0–100.0)
Platelets: 157 10*3/uL (ref 150–400)
RBC: 4.61 MIL/uL (ref 4.22–5.81)
RDW: 13.2 % (ref 11.5–15.5)
WBC: 9.6 10*3/uL (ref 4.0–10.5)
nRBC: 0 % (ref 0.0–0.2)

## 2019-11-22 LAB — ECHOCARDIOGRAM COMPLETE

## 2019-11-22 LAB — SURGICAL PCR SCREEN
MRSA, PCR: NEGATIVE
Staphylococcus aureus: POSITIVE — AB

## 2019-11-22 SURGERY — ARTHROPLASTY, HIP, TOTAL, ANTERIOR APPROACH
Anesthesia: General | Site: Hip | Laterality: Left

## 2019-11-22 MED ORDER — MORPHINE SULFATE (PF) 2 MG/ML IV SOLN: 0.5000 mg | INTRAVENOUS | Status: DC | PRN

## 2019-11-22 MED ORDER — ROCURONIUM BROMIDE 10 MG/ML (PF) SYRINGE
PREFILLED_SYRINGE | INTRAVENOUS | Status: AC
  Filled 2019-11-22: qty 10

## 2019-11-22 MED ORDER — EPINEPHRINE 1 MG/10ML IJ SOSY
PREFILLED_SYRINGE | INTRAMUSCULAR | Status: AC
  Filled 2019-11-22: qty 10

## 2019-11-22 MED ORDER — FENTANYL CITRATE (PF) 100 MCG/2ML IJ SOLN
INTRAMUSCULAR | Status: DC | PRN
  Administered 2019-11-22: 50 ug via INTRAVENOUS
  Administered 2019-11-22: 25 ug via INTRAVENOUS

## 2019-11-22 MED ORDER — METOCLOPRAMIDE HCL 5 MG/ML IJ SOLN: 5.0000 mg | Freq: Three times a day (TID) | INTRAMUSCULAR | Status: DC | PRN

## 2019-11-22 MED ORDER — ETOMIDATE 2 MG/ML IV SOLN
INTRAVENOUS | Status: DC | PRN
  Administered 2019-11-22: 20 mg via INTRAVENOUS

## 2019-11-22 MED ORDER — 0.9 % SODIUM CHLORIDE (POUR BTL) OPTIME
TOPICAL | Status: DC | PRN
  Administered 2019-11-22: 1000 mL

## 2019-11-22 MED ORDER — CEFAZOLIN SODIUM-DEXTROSE 2-4 GM/100ML-% IV SOLN
INTRAVENOUS | Status: AC
  Filled 2019-11-22: qty 100

## 2019-11-22 MED ORDER — ONDANSETRON HCL 4 MG/2ML IJ SOLN
INTRAMUSCULAR | Status: DC | PRN
  Administered 2019-11-22: 4 mg via INTRAVENOUS

## 2019-11-22 MED ORDER — SUCCINYLCHOLINE CHLORIDE 200 MG/10ML IV SOSY
PREFILLED_SYRINGE | INTRAVENOUS | Status: AC
  Filled 2019-11-22: qty 10

## 2019-11-22 MED ORDER — MENTHOL 3 MG MT LOZG: 1.0000 | LOZENGE | OROMUCOSAL | Status: DC | PRN

## 2019-11-22 MED ORDER — STERILE WATER FOR IRRIGATION IR SOLN
Status: DC | PRN
  Administered 2019-11-22: 2000 mL

## 2019-11-22 MED ORDER — PHENYLEPHRINE HCL (PRESSORS) 10 MG/ML IV SOLN
INTRAVENOUS | Status: AC
  Filled 2019-11-22: qty 1

## 2019-11-22 MED ORDER — ONDANSETRON HCL 4 MG/2ML IJ SOLN
INTRAMUSCULAR | Status: AC
  Filled 2019-11-22: qty 2

## 2019-11-22 MED ORDER — MORPHINE SULFATE (PF) 2 MG/ML IV SOLN
1.0000 mg | INTRAVENOUS | Status: DC | PRN
  Administered 2019-11-22 – 2019-11-24 (×4): 2 mg via INTRAVENOUS
  Administered 2019-11-25: 1 mg via INTRAVENOUS
  Filled 2019-11-22 (×5): qty 1

## 2019-11-22 MED ORDER — HYDROMORPHONE HCL 1 MG/ML IJ SOLN
INTRAMUSCULAR | Status: AC
  Filled 2019-11-22: qty 2

## 2019-11-22 MED ORDER — PROMETHAZINE HCL 25 MG/ML IJ SOLN: 6.2500 mg | INTRAMUSCULAR | Status: DC | PRN

## 2019-11-22 MED ORDER — DOCUSATE SODIUM 100 MG PO CAPS
100.0000 mg | ORAL_CAPSULE | Freq: Two times a day (BID) | ORAL | Status: DC
  Administered 2019-11-22 – 2019-11-24 (×4): 100 mg via ORAL
  Filled 2019-11-22 (×7): qty 1

## 2019-11-22 MED ORDER — HYDROCODONE-ACETAMINOPHEN 5-325 MG PO TABS: 1.0000 | ORAL_TABLET | ORAL | Status: DC | PRN

## 2019-11-22 MED ORDER — ONDANSETRON HCL 4 MG/2ML IJ SOLN: 4.0000 mg | Freq: Four times a day (QID) | INTRAMUSCULAR | Status: DC | PRN

## 2019-11-22 MED ORDER — HYDROCODONE-ACETAMINOPHEN 7.5-325 MG PO TABS
1.0000 | ORAL_TABLET | ORAL | Status: DC | PRN
  Filled 2019-11-22: qty 1

## 2019-11-22 MED ORDER — SUCCINYLCHOLINE CHLORIDE 200 MG/10ML IV SOSY
PREFILLED_SYRINGE | INTRAVENOUS | Status: DC | PRN
  Administered 2019-11-22: 100 mg via INTRAVENOUS

## 2019-11-22 MED ORDER — FENTANYL CITRATE (PF) 100 MCG/2ML IJ SOLN
INTRAMUSCULAR | Status: AC
  Filled 2019-11-22: qty 2

## 2019-11-22 MED ORDER — OXYCODONE HCL 5 MG/5ML PO SOLN: 5.0000 mg | Freq: Once | ORAL | Status: DC | PRN

## 2019-11-22 MED ORDER — CEFAZOLIN SODIUM-DEXTROSE 2-4 GM/100ML-% IV SOLN
2.0000 g | Freq: Four times a day (QID) | INTRAVENOUS | Status: AC
  Administered 2019-11-22 – 2019-11-23 (×2): 2 g via INTRAVENOUS
  Filled 2019-11-22 (×2): qty 100

## 2019-11-22 MED ORDER — HYDROMORPHONE HCL 1 MG/ML IJ SOLN: 0.2500 mg | INTRAMUSCULAR | Status: DC | PRN

## 2019-11-22 MED ORDER — PHENYLEPHRINE HCL (PRESSORS) 10 MG/ML IV SOLN
INTRAVENOUS | Status: DC | PRN
  Administered 2019-11-22: 80 ug via INTRAVENOUS

## 2019-11-22 MED ORDER — SUGAMMADEX SODIUM 200 MG/2ML IV SOLN
INTRAVENOUS | Status: DC | PRN
  Administered 2019-11-22: 160 mg via INTRAVENOUS

## 2019-11-22 MED ORDER — ONDANSETRON HCL 4 MG PO TABS: 4.0000 mg | ORAL_TABLET | Freq: Four times a day (QID) | ORAL | Status: DC | PRN

## 2019-11-22 MED ORDER — OXYCODONE HCL 5 MG PO TABS: 5.0000 mg | ORAL_TABLET | Freq: Once | ORAL | Status: DC | PRN

## 2019-11-22 MED ORDER — PHENOL 1.4 % MT LIQD
1.0000 | OROMUCOSAL | Status: DC | PRN
  Filled 2019-11-22: qty 177

## 2019-11-22 MED ORDER — ASPIRIN EC 325 MG PO TBEC
325.0000 mg | DELAYED_RELEASE_TABLET | Freq: Every day | ORAL | Status: DC
  Administered 2019-11-24 – 2019-11-26 (×3): 325 mg via ORAL
  Filled 2019-11-22 (×4): qty 1

## 2019-11-22 MED ORDER — ETOMIDATE 2 MG/ML IV SOLN
INTRAVENOUS | Status: AC
  Filled 2019-11-22: qty 10

## 2019-11-22 MED ORDER — PERFLUTREN LIPID MICROSPHERE
1.0000 mL | INTRAVENOUS | Status: AC | PRN
  Administered 2019-11-22: 2 mL via INTRAVENOUS
  Filled 2019-11-22: qty 10

## 2019-11-22 MED ORDER — ROCURONIUM BROMIDE 100 MG/10ML IV SOLN
INTRAVENOUS | Status: DC | PRN
  Administered 2019-11-22: 40 mg via INTRAVENOUS

## 2019-11-22 MED ORDER — SODIUM CHLORIDE 0.9 % IV SOLN
INTRAVENOUS | Status: DC | PRN
  Administered 2019-11-22: 16:00:00 40 ug/min via INTRAVENOUS

## 2019-11-22 MED ORDER — CEFAZOLIN SODIUM-DEXTROSE 2-3 GM-%(50ML) IV SOLR
INTRAVENOUS | Status: DC | PRN
  Administered 2019-11-22: 2 g via INTRAVENOUS

## 2019-11-22 MED ORDER — LACTATED RINGERS IV SOLN: INTRAVENOUS | Status: DC

## 2019-11-22 MED ORDER — METOCLOPRAMIDE HCL 5 MG PO TABS: 5.0000 mg | ORAL_TABLET | Freq: Three times a day (TID) | ORAL | Status: DC | PRN

## 2019-11-22 MED ORDER — ACETAMINOPHEN 325 MG PO TABS
325.0000 mg | ORAL_TABLET | Freq: Four times a day (QID) | ORAL | Status: DC | PRN
  Administered 2019-11-26: 650 mg via ORAL
  Filled 2019-11-22: qty 2

## 2019-11-22 SURGICAL SUPPLY — 43 items
APL SKNCLS STERI-STRIP NONHPOA (GAUZE/BANDAGES/DRESSINGS)
BAG SPEC THK2 15X12 ZIP CLS (MISCELLANEOUS)
BAG ZIPLOCK 12X15 (MISCELLANEOUS) IMPLANT
BALL HIP ARTICU 28 +5 (Hips) IMPLANT
BENZOIN TINCTURE PRP APPL 2/3 (GAUZE/BANDAGES/DRESSINGS) IMPLANT
BIPOLAR AML DEPUY 53 (Hips) ×2 IMPLANT
BLADE SAW SGTL 18X1.27X75 (BLADE) ×2 IMPLANT
COVER PERINEAL POST (MISCELLANEOUS) ×2 IMPLANT
COVER SURGICAL LIGHT HANDLE (MISCELLANEOUS) ×2 IMPLANT
COVER WAND RF STERILE (DRAPES) ×2 IMPLANT
DRAPE STERI IOBAN 125X83 (DRAPES) ×2 IMPLANT
DRAPE U-SHAPE 47X51 STRL (DRAPES) ×4 IMPLANT
DRESSING AQUACEL AG SP 3.5X10 (GAUZE/BANDAGES/DRESSINGS) IMPLANT
DRSG AQUACEL AG ADV 3.5X10 (GAUZE/BANDAGES/DRESSINGS) ×2 IMPLANT
DRSG AQUACEL AG SP 3.5X10 (GAUZE/BANDAGES/DRESSINGS) ×2
DURAPREP 26ML APPLICATOR (WOUND CARE) ×2 IMPLANT
ELECT REM PT RETURN 15FT ADLT (MISCELLANEOUS) ×2 IMPLANT
GAUZE XEROFORM 1X8 LF (GAUZE/BANDAGES/DRESSINGS) ×2 IMPLANT
GLOVE BIO SURGEON STRL SZ7.5 (GLOVE) ×2 IMPLANT
GLOVE BIOGEL PI IND STRL 8 (GLOVE) ×2 IMPLANT
GLOVE BIOGEL PI INDICATOR 8 (GLOVE) ×2
GLOVE ECLIPSE 8.0 STRL XLNG CF (GLOVE) ×2 IMPLANT
GOWN STRL REUS W/TWL XL LVL3 (GOWN DISPOSABLE) ×4 IMPLANT
HANDPIECE INTERPULSE COAX TIP (DISPOSABLE) ×2
HEAD BIPOLAR AML DEPUY 53 (Hips) IMPLANT
HIP BALL ARTICU 28 +5 (Hips) ×2 IMPLANT
HOLDER FOLEY CATH W/STRAP (MISCELLANEOUS) ×2 IMPLANT
KIT TURNOVER KIT A (KITS) IMPLANT
PACK ANTERIOR HIP CUSTOM (KITS) ×2 IMPLANT
PENCIL SMOKE EVACUATOR (MISCELLANEOUS) ×1 IMPLANT
SET HNDPC FAN SPRY TIP SCT (DISPOSABLE) ×1 IMPLANT
STAPLER VISISTAT 35W (STAPLE) ×1 IMPLANT
STEM CORAIL KA15 (Stem) ×1 IMPLANT
STRIP CLOSURE SKIN 1/2X4 (GAUZE/BANDAGES/DRESSINGS) IMPLANT
SUT ETHIBOND NAB CT1 #1 30IN (SUTURE) ×2 IMPLANT
SUT ETHILON 2 0 PS N (SUTURE) IMPLANT
SUT MNCRL AB 4-0 PS2 18 (SUTURE) IMPLANT
SUT VIC AB 0 CT1 36 (SUTURE) ×2 IMPLANT
SUT VIC AB 1 CT1 36 (SUTURE) ×2 IMPLANT
SUT VIC AB 2-0 CT1 27 (SUTURE) ×4
SUT VIC AB 2-0 CT1 TAPERPNT 27 (SUTURE) ×2 IMPLANT
TRAY FOLEY MTR SLVR 16FR STAT (SET/KITS/TRAYS/PACK) IMPLANT
YANKAUER SUCT BULB TIP 10FT TU (MISCELLANEOUS) ×2 IMPLANT

## 2019-11-22 NOTE — Transfer of Care (Signed)
Immediate Anesthesia Transfer of Care Note  Patient: Frank Gates.  Procedure(s) Performed: Procedure(s): left hip hemiarthroplasty vsTOTAL HIP ARTHROPLASTY ANTERIOR APPROACH (Left)  Patient Location: PACU  Anesthesia Type:General  Level of Consciousness: Patient easily awoken, sedated, comfortable, cooperative, following commands, responds to stimulation.   Airway & Oxygen Therapy: Patient spontaneously breathing, ventilating well, oxygen via simple oxygen mask.  Post-op Assessment: Report given to PACU RN, vital signs reviewed and stable, moving all extremities.   Post vital signs: Reviewed and stable.  Complications: No apparent anesthesia complications  Last Vitals:  Vitals Value Taken Time  BP 114/80 11/22/19 1650  Temp    Pulse 90 11/22/19 1653  Resp 21 11/22/19 1656  SpO2 100 % 11/22/19 1653  Vitals shown include unvalidated device data.  Last Pain:  Vitals:   11/22/19 1311  TempSrc: Oral  PainSc:          Complications: No apparent anesthesia complications

## 2019-11-22 NOTE — Brief Op Note (Signed)
11/21/2019 - 11/22/2019  4:33 PM  PATIENT:  Christiane Ha.  86 y.o. male  PRE-OPERATIVE DIAGNOSIS:  left hip fracture  POST-OPERATIVE DIAGNOSIS:  left hip fracture  PROCEDURE:  Procedure(s): left hip hemiarthroplasty vsTOTAL HIP ARTHROPLASTY ANTERIOR APPROACH (Left)  SURGEON:  Surgeon(s) and Role:    Kathryne Hitch, MD - Primary  PHYSICIAN ASSISTANT: Rexene Edison, PA-C  ANESTHESIA:   general  EBL:  200 mL   COUNTS:  YES  DICTATION: .Other Dictation: Dictation Number [id number]  PLAN OF CARE: Admit to inpatient   PATIENT DISPOSITION:  PACU - hemodynamically stable.   Delay start of Pharmacological VTE agent (>24hrs) due to surgical blood loss or risk of bleeding: no

## 2019-11-22 NOTE — Anesthesia Procedure Notes (Signed)
Arterial Line Insertion Start/End2/05/2020 3:15 PM, 11/22/2019 3:17 PM Performed by: Kaylyn Layer, MD, anesthesiologist  Patient location: Pre-op. Preanesthetic checklist: patient identified, IV checked, site marked, risks and benefits discussed, surgical consent, monitors and equipment checked, pre-op evaluation, timeout performed and anesthesia consent Lidocaine 1% used for infiltration Right, radial was placed Catheter size: 20 Fr Hand hygiene performed  and maximum sterile barriers used   Attempts: 3 Procedure performed without using ultrasound guided technique. Following insertion, dressing applied. Post procedure assessment: normal and unchanged  Patient tolerated the procedure well with no immediate complications. Additional procedure comments: Several attempts on left radial prior to my arrival unsuccessful by CRNA and Hyacinth Meeker, MD. One attempt on right radial by myself.Marland Kitchen

## 2019-11-22 NOTE — Progress Notes (Signed)
  Echocardiogram 2D Echocardiogram with definity has been performed.  Leta Jungling M 11/22/2019, 8:53 AM

## 2019-11-22 NOTE — Anesthesia Postprocedure Evaluation (Signed)
Anesthesia Post Note  Patient: Damarion Mendizabal.  Procedure(s) Performed: left hip hemiarthroplasty vsTOTAL HIP ARTHROPLASTY ANTERIOR APPROACH (Left Hip)     Patient location during evaluation: PACU Anesthesia Type: General Level of consciousness: responds to stimulation and confused (Consistent with preop status) Pain management: pain level controlled Vital Signs Assessment: post-procedure vital signs reviewed and stable Respiratory status: spontaneous breathing, nonlabored ventilation and respiratory function stable Cardiovascular status: blood pressure returned to baseline Postop Assessment: no apparent nausea or vomiting Anesthetic complications: no    Last Vitals:  Vitals:   11/22/19 1700 11/22/19 1715  BP: (!) 111/96 121/84  Pulse: (P) 95 95  Resp: 20 (!) 21  Temp:    SpO2: 100% 98%    Last Pain:  Vitals:   11/22/19 1650  TempSrc:   PainSc: Asleep                 Kaylyn Layer

## 2019-11-22 NOTE — Anesthesia Procedure Notes (Signed)
Procedure Name: Intubation Date/Time: 11/22/2019 3:31 PM Performed by: Deliah Boston, CRNA Pre-anesthesia Checklist: Patient identified, Emergency Drugs available, Suction available and Patient being monitored Patient Re-evaluated:Patient Re-evaluated prior to induction Oxygen Delivery Method: Circle system utilized Preoxygenation: Pre-oxygenation with 100% oxygen Induction Type: IV induction Ventilation: Mask ventilation without difficulty Laryngoscope Size: Mac and 4 Grade View: Grade I Tube type: Oral Tube size: 7.5 mm Number of attempts: 1 Airway Equipment and Method: Stylet and Oral airway Placement Confirmation: ETT inserted through vocal cords under direct vision,  positive ETCO2 and breath sounds checked- equal and bilateral Secured at: 23 cm Tube secured with: Tape Dental Injury: Teeth and Oropharynx as per pre-operative assessment

## 2019-11-22 NOTE — Anesthesia Preprocedure Evaluation (Signed)
Anesthesia Evaluation  Patient identified by MRN, date of birth, ID band Patient awake    Reviewed: Allergy & Precautions, NPO status , Patient's Chart, lab work & pertinent test results  Airway Mallampati: II  TM Distance: >3 FB Neck ROM: Full    Dental no notable dental hx.    Pulmonary neg pulmonary ROS,    Pulmonary exam normal breath sounds clear to auscultation       Cardiovascular hypertension, Pt. on medications + CAD, + CABG and +CHF  Normal cardiovascular exam+ Valvular Problems/Murmurs AS  Rhythm:Regular Rate:Normal     Neuro/Psych Dementia negative neurological ROS  negative psych ROS   GI/Hepatic negative GI ROS, Neg liver ROS,   Endo/Other  negative endocrine ROS  Renal/GU negative Renal ROS  negative genitourinary   Musculoskeletal negative musculoskeletal ROS (+)   Abdominal   Peds negative pediatric ROS (+)  Hematology negative hematology ROS (+)   Anesthesia Other Findings   Reproductive/Obstetrics negative OB ROS                             Anesthesia Physical Anesthesia Plan  ASA: IV  Anesthesia Plan: General   Post-op Pain Management:    Induction: Intravenous  PONV Risk Score and Plan: 2 and Ondansetron, Midazolam and Treatment may vary due to age or medical condition  Airway Management Planned: Oral ETT  Additional Equipment:   Intra-op Plan:   Post-operative Plan: Extubation in OR  Informed Consent: I have reviewed the patients History and Physical, chart, labs and discussed the procedure including the risks, benefits and alternatives for the proposed anesthesia with the patient or authorized representative who has indicated his/her understanding and acceptance.     Dental advisory given  Plan Discussed with: CRNA  Anesthesia Plan Comments:         Anesthesia Quick Evaluation

## 2019-11-22 NOTE — Progress Notes (Signed)
PT Cancellation Note  Patient Details Name: Frank Gates. MRN: [id number] DOB: 1926-06-19   Cancelled Treatment:    Reason Eval/Treat Not Completed: Medical issues which prohibited therapy  Pt admitted with femoral fracture with plans for surgery.  Will follow up post op as able.   Royetta Asal, PT Acute Rehab Services Pager [phone] [facility] [phone] [facility] [phone]    Rayetta Humphrey 11/22/2019, 9:56 AM

## 2019-11-22 NOTE — Progress Notes (Signed)
PROGRESS NOTE    Frank Gates.  ZOX:[id number] DOB: 12-17-1925 DOA: 11/21/2019 PCP: Merlene Laughter, MD   Brief Narrative: Frank Siverson. is a 86 y.o. male with PMH significant for Alzheimer's dementia, HTN, HLD, CAD s/p CABG 2001, ischemic heart disease, mild systolic CHF, mild to moderate aortic stenosis. Patient presented secondary to fall, suffering a left hip fracture.   Assessment & Plan:   Active Problems:   Hip fracture (HCC)   Hip fracture Left hip fracture suffered after a fall. Complicated by history of dementia. Patient is ambulatory at baseline with a cane. -Orthopedic surgery recommendations: surgery today -Pain management with Tylenol, Morphine IV  Alzheimer's dementia Currently resides at home with his son and caregivers.  Essential hypertension Blood pressure mostly normotensive. Not on medications as an outpatient.  CAD Patient is s/p CABG x6 in 2001. Currently not on medication.  Hyperlipidemia Not on a statin.  Ischemic heart disease Last Transthoracic Echocardiogram from 2008 significant for low-normal LV function and impaired relaxation. Transthoracic Echocardiogram ordered this admission and is pending. Patient is on Lasix as an outpatient  Mild-moderate aortic stenosis Patient appears to be functional. Fall is likely secondary to ambulatory dysfunction but patient is a poor historian.   DVT prophylaxis: SCDs Code Status:   Code Status: DNR Family Communication: Daughter at bedside Disposition Plan: Anticipate discharge home when medically stable. Family would prefer not to send patient to SNF as they have caregivers at home   Consultants:   Orthopedic surgery  Procedures:   None  Antimicrobials:  None    Subjective: No issues overnight other than patient trying to get out of bed.  Objective: Vitals:   11/21/19 2246 11/22/19 0008 11/22/19 0413 11/22/19 0852  BP: 134/90 90/60 (!) 139/100 119/83  Pulse: (!) 118 87 71 88    Resp: 19 15 16  (!) 22  Temp: 98.5 F (36.9 C) 98.4 F (36.9 C) 98 F (36.7 C) 99.3 F (37.4 C)  TempSrc: Oral Oral Oral Axillary  SpO2: 98% 99% 97% 97%    Intake/Output Summary (Last 24 hours) at 11/22/2019 1210 Last data filed at 11/22/2019 0416 Gross per 24 hour  Intake 3 ml  Output 200 ml  Net -197 ml   There were no vitals filed for this visit.  Examination:  General exam: Appears calm and comfortable Respiratory system: Clear to auscultation. Respiratory effort normal. Cardiovascular system: S1 & S2 heard, RRR. No murmurs, rubs, gallops or clicks. Gastrointestinal system: Abdomen is nondistended, soft and nontender. No organomegaly or masses felt. Normal bowel sounds heard. Central nervous system: Sleeping but arouses easily and oriented to self. Extremities: No edema. No calf tenderness Skin: No cyanosis. No rashes Psychiatry: Judgement and insight appear impaired.    Data Reviewed: I have personally reviewed following labs and imaging studies  CBC: Recent Labs  Lab 11/21/19 1123 11/22/19 0536  WBC 11.1* 9.6  NEUTROABS 9.6*  --   HGB 15.3 13.9  HCT 47.4 43.8  MCV 94.0 95.0  PLT 190 157   Basic Metabolic Panel: Recent Labs  Lab 11/21/19 1123 11/22/19 0536  NA 137 141  K 3.8 3.8  CL 105 110  CO2 26 23  GLUCOSE 144* 123*  BUN 37* 34*  CREATININE 1.50* 1.41*  CALCIUM 9.3 9.4   GFR: CrCl cannot be calculated (Unknown ideal weight.). Liver Function Tests: No results for input(s): AST, ALT, ALKPHOS, BILITOT, PROT, ALBUMIN in the last 168 hours. No results for input(s): LIPASE, AMYLASE in the last  168 hours. No results for input(s): AMMONIA in the last 168 hours. Coagulation Profile: No results for input(s): INR, PROTIME in the last 168 hours. Cardiac Enzymes: Recent Labs  Lab 11/21/19 1123  CKTOTAL 49   BNP (last 3 results) No results for input(s): PROBNP in the last 8760 hours. HbA1C: No results for input(s): HGBA1C in the last 72  hours. CBG: No results for input(s): GLUCAP in the last 168 hours. Lipid Profile: No results for input(s): CHOL, HDL, LDLCALC, TRIG, CHOLHDL, LDLDIRECT in the last 72 hours. Thyroid Function Tests: No results for input(s): TSH, T4TOTAL, FREET4, T3FREE, THYROIDAB in the last 72 hours. Anemia Panel: No results for input(s): VITAMINB12, FOLATE, FERRITIN, TIBC, IRON, RETICCTPCT in the last 72 hours. Sepsis Labs: No results for input(s): PROCALCITON, LATICACIDVEN in the last 168 hours.  Recent Results (from the past 240 hour(s))  SARS CORONAVIRUS 2 (TAT 6-24 HRS) Nasopharyngeal Nasopharyngeal Swab     Status: None   Collection Time: 11/21/19 11:23 AM   Specimen: Nasopharyngeal Swab  Result Value Ref Range Status   SARS Coronavirus 2 NEGATIVE NEGATIVE Final    Comment: (NOTE) SARS-CoV-2 target nucleic acids are NOT DETECTED. The SARS-CoV-2 RNA is generally detectable in upper and lower respiratory specimens during the acute phase of infection. Negative results do not preclude SARS-CoV-2 infection, do not rule out co-infections with other pathogens, and should not be used as the sole basis for treatment or other patient management decisions. Negative results must be combined with clinical observations, patient history, and epidemiological information. The expected result is Negative. Fact Sheet for Patients: HairSlick.no Fact Sheet for Healthcare Providers: quierodirigir.com This test is not yet approved or cleared by the [place] FDA and  has been authorized for detection and/or diagnosis of SARS-CoV-2 by FDA under an Emergency Use Authorization (EUA). This EUA will remain  in effect (meaning this test can be used) for the duration of the COVID-19 declaration under Section 56 4(b)(1) of the Act, 21 U.S.C. section 360bbb-3(b)(1), unless the authorization is terminated or revoked sooner. Performed at [facility],  [address] [place], [place] [address]   Surgical pcr screen     Status: Abnormal   Collection Time: 11/22/19  5:25 AM   Specimen: Nasal Mucosa; Nasal Swab  Result Value Ref Range Status   MRSA, PCR NEGATIVE NEGATIVE Final   Staphylococcus aureus POSITIVE (A) NEGATIVE Final    Comment: (NOTE) The Xpert SA Assay (FDA approved for NASAL specimens in patients 46 years of age and older), is one component of a comprehensive surveillance program. It is not intended to diagnose infection nor to guide or monitor treatment. Performed at [facility], [address] [place], [place] [address]          Radiology Studies: DG Knee 1-2 Views Left  Result Date: 11/21/2019 CLINICAL DATA:  86 year old who fell and complains of LEFT hip and LEFT UPPER leg pain. Initial encounter. EXAM: LEFT KNEE - 1-2 VIEW COMPARISON:  None. FINDINGS: No evidence of acute fracture or dislocation. Moderate MEDIAL and LATERAL compartment joint space narrowing. Patellofemoral joint space well-preserved. Enthesopathic spurring at the insertion of the quadriceps tendon on the SUPERIOR patella and at the insertion of the patellar tendon on the INFERIOR patella. No visible joint effusion. Femoropopliteal atherosclerosis. IMPRESSION: 1. No acute osseous abnormality. 2. Moderate MEDIAL and LATERAL compartment osteoarthritis. Electronically Signed   By: Hulan Saas M.D.   On: 11/21/2019 12:30   CT Head Wo Contrast  Result  Date: 11/21/2019 CLINICAL DATA:  Unwitnessed fall. EXAM: CT HEAD WITHOUT CONTRAST CT CERVICAL SPINE WITHOUT CONTRAST TECHNIQUE: Multidetector CT imaging of the head and cervical spine was performed following the standard protocol without intravenous contrast. Multiplanar CT image reconstructions of the cervical spine were also generated. COMPARISON:  October 25, 2010. FINDINGS: CT HEAD FINDINGS Brain: Mild diffuse cortical atrophy is noted. Mild chronic ischemic white matter disease is  noted. No mass effect or midline shift is noted. Ventricular size is within normal limits. There is no evidence of mass lesion, hemorrhage or acute infarction. Vascular: No hyperdense vessel or unexpected calcification. Skull: Normal. Negative for fracture or focal lesion. Sinuses/Orbits: No acute finding. Other: None. CT CERVICAL SPINE FINDINGS Alignment: Normal. Skull base and vertebrae: No acute fracture. No primary bone lesion or focal pathologic process. Soft tissues and spinal canal: No prevertebral fluid or swelling. No visible canal hematoma. Disc levels: Mild degenerative disc disease is noted at C4-5, C5-6 and C6-7. Upper chest: Negative. Other: Degenerative changes are seen involving posterior facet joints bilaterally. IMPRESSION: 1. Mild diffuse cortical atrophy. Mild chronic ischemic white matter disease. No acute intracranial abnormality seen. 2. Multilevel degenerative disc disease. No acute abnormality seen in the cervical spine. Electronically Signed   By: Lupita Raider M.D.   On: 11/21/2019 12:36   CT Cervical Spine Wo Contrast  Result Date: 11/21/2019 CLINICAL DATA:  Unwitnessed fall. EXAM: CT HEAD WITHOUT CONTRAST CT CERVICAL SPINE WITHOUT CONTRAST TECHNIQUE: Multidetector CT imaging of the head and cervical spine was performed following the standard protocol without intravenous contrast. Multiplanar CT image reconstructions of the cervical spine were also generated. COMPARISON:  October 25, 2010. FINDINGS: CT HEAD FINDINGS Brain: Mild diffuse cortical atrophy is noted. Mild chronic ischemic white matter disease is noted. No mass effect or midline shift is noted. Ventricular size is within normal limits. There is no evidence of mass lesion, hemorrhage or acute infarction. Vascular: No hyperdense vessel or unexpected calcification. Skull: Normal. Negative for fracture or focal lesion. Sinuses/Orbits: No acute finding. Other: None. CT CERVICAL SPINE FINDINGS Alignment: Normal. Skull base and  vertebrae: No acute fracture. No primary bone lesion or focal pathologic process. Soft tissues and spinal canal: No prevertebral fluid or swelling. No visible canal hematoma. Disc levels: Mild degenerative disc disease is noted at C4-5, C5-6 and C6-7. Upper chest: Negative. Other: Degenerative changes are seen involving posterior facet joints bilaterally. IMPRESSION: 1. Mild diffuse cortical atrophy. Mild chronic ischemic white matter disease. No acute intracranial abnormality seen. 2. Multilevel degenerative disc disease. No acute abnormality seen in the cervical spine. Electronically Signed   By: Lupita Raider M.D.   On: 11/21/2019 12:36   DG Chest Portable 1 View  Result Date: 11/21/2019 CLINICAL DATA:  Preop. EXAM: PORTABLE CHEST 1 VIEW COMPARISON:  May 29, 2011. FINDINGS: Mild cardiomegaly is noted. No pneumothorax or pleural effusion is noted. Status post coronary bypass graft. Both lungs are clear. The visualized skeletal structures are unremarkable. IMPRESSION: No active disease. Electronically Signed   By: Lupita Raider M.D.   On: 11/21/2019 13:52   DG Hip Unilat W or Wo Pelvis 2-3 Views Left  Result Date: 11/21/2019 CLINICAL DATA:  86 year old who fell and complains of LEFT hip and LEFT UPPER leg pain. Initial encounter. EXAM: DG HIP (WITH OR WITHOUT PELVIS) 2-3V LEFT COMPARISON:  None. FINDINGS: Acute subcapital or basicervical LEFT femoral neck fracture. LEFT hip joint anatomically aligned with well-preserved joint space for age. Bone mineral density well preserved for  age. Included AP pelvis demonstrates no fractures elsewhere. Well preserved joint space in the contralateral RIGHT hip. Sacroiliac joints anatomically aligned without diastasis, demonstrating only mild degenerative changes. Symphysis pubis anatomically aligned without diastasis or degenerative changes. IMPRESSION: Acute subcapital or basicervical LEFT femoral neck fracture. Electronically Signed   By: Evangeline Dakin M.D.    On: 11/21/2019 12:28   DG Femur Min 2 Views Left  Result Date: 11/21/2019 CLINICAL DATA:  86 year old who fell and complains of LEFT hip and LEFT UPPER leg pain. Initial encounter. EXAM: LEFT FEMUR 2 VIEWS COMPARISON:  None. FINDINGS: Subcapital/basicervical LEFT femoral neck fracture as detailed on the report of the concurrent LEFT hip images. No fractures elsewhere involving the femur. Well-preserved bone mineral density. Femoropopliteal atherosclerosis. IMPRESSION: Subcapital/basicervical LEFT femoral neck fracture as detailed on the report of the concurrent LEFT hip images. No fractures elsewhere involving the femur. Electronically Signed   By: Evangeline Dakin M.D.   On: 11/21/2019 12:32        Scheduled Meds: . senna  1 tablet Oral BID  . sodium chloride flush  3 mL Intravenous Q12H   Continuous Infusions: . sodium chloride 50 mL/hr at 11/21/19 1808     LOS: 1 day     Cordelia Poche, MD [facility] 11/22/2019, 12:10 PM  If 7PM-7AM, please contact night-coverage [email or web address]

## 2019-11-23 ENCOUNTER — Encounter: Payer: Self-pay | Admitting: *Deleted

## 2019-11-23 ENCOUNTER — Inpatient Hospital Stay (HOSPITAL_COMMUNITY): Payer: Medicare Other

## 2019-11-23 LAB — BASIC METABOLIC PANEL
Anion gap: 4 — ABNORMAL LOW (ref 5–15)
BUN: 32 mg/dL — ABNORMAL HIGH (ref 8–23)
CO2: 25 mmol/L (ref 22–32)
Calcium: 9 mg/dL (ref 8.9–10.3)
Chloride: 113 mmol/L — ABNORMAL HIGH (ref 98–111)
Creatinine, Ser: 1.41 mg/dL — ABNORMAL HIGH (ref 0.61–1.24)
GFR calc Af Amer: 50 mL/min — ABNORMAL LOW (ref >60)
GFR calc non Af Amer: 43 mL/min — ABNORMAL LOW (ref >60)
Glucose, Bld: 115 mg/dL — ABNORMAL HIGH (ref 70–99)
Potassium: 4.3 mmol/L (ref 3.5–5.1)
Sodium: 142 mmol/L (ref 135–145)

## 2019-11-23 LAB — CBC
HCT: 43.4 % (ref 39.0–52.0)
Hemoglobin: 13.6 g/dL (ref 13.0–17.0)
MCH: 30.4 pg (ref 26.0–34.0)
MCHC: 31.3 g/dL (ref 30.0–36.0)
MCV: 96.9 fL (ref 80.0–100.0)
Platelets: 141 10*3/uL — ABNORMAL LOW (ref 150–400)
RBC: 4.48 MIL/uL (ref 4.22–5.81)
RDW: 13.1 % (ref 11.5–15.5)
WBC: 8.4 10*3/uL (ref 4.0–10.5)
nRBC: 0 % (ref 0.0–0.2)

## 2019-11-23 MED ORDER — FUROSEMIDE 10 MG/ML IJ SOLN
20.0000 mg | Freq: Every day | INTRAMUSCULAR | Status: DC
  Administered 2019-11-23 – 2019-11-25 (×3): 20 mg via INTRAVENOUS
  Filled 2019-11-23 (×3): qty 2

## 2019-11-23 NOTE — Progress Notes (Signed)
   Vital Signs MEWS/VS Documentation      11/23/2019 0500 11/23/2019 0701 11/23/2019 0812 11/23/2019 0900   MEWS Score:  1  2  2  2    MEWS Score Color:  Green  Yellow  Yellow  Yellow   Resp:  --  --  20  --   Pulse:  --  --  (!) 104  --   BP:  105/69  --  (!) 106/92  --   Temp:  --  --  98.7 F (37.1 C)  --   O2 Device:  --  --  Nasal Cannula  --   Level of Consciousness:  --  --  --  Responds to Voice     Yellow mew protocol initiated due to pt's BP. RN assessed pt. Pt found sleep, easily arousable and in no apparent distress. Will continue to monitor pt.     Loukisha Gunnerson D Airyn Ellzey 11/23/2019,11:06 AM

## 2019-11-23 NOTE — TOC Initial Note (Signed)
Transition of Care (TOC) - Initial/Assessment Note    Patient Details  Name: Frank Gates. MRN: [id number] Date of Birth: [birth year]  Transition of Care [facility]) CM/SW Contact:    Trish Mage, LCSW Phone Number: 11/23/2019, 3:06 PM  Clinical Narrative:   Frank Gates is here for surgery following a fall.  His children are by his bedside, and daughter states she is POA.  Unable to carry on meaningful conversation with patient, who is also diagnosed with dementia.  Currently, they have 4 hours a day, 6 days a week of in-home care, and are anticipating expanding that depending on the need as they want the patient to return home.  Both Frank Gates and a close family friend live in the home and are available to help as well.  They have a walker in the home; are interested in 3 in 1 and hospital bed if appropriate. They have no preference of [place] agencies, but confirm they want HH PT,OT.  I explained the process of assessment and subsequent recommendations.  No further questions at this point. TOC will continue to follow during the course of hospitalization.                  Expected Discharge Plan: [place] Barriers to Discharge: No Barriers Identified   Patient Goals and CMS Choice Patient states their goals for this hospitalization and ongoing recovery are:: Dementia   Choice offered to / list presented to : Patient, Adult Children  Expected Discharge Plan and Services Expected Discharge Plan: [place]   Discharge Planning Services: CM Consult Post Acute Care Choice: Home Health, Durable Medical Equipment Living arrangements for the past 2 months: Single Family Home                                      Prior Living Arrangements/Services Living arrangements for the past 2 months: Single Family Home Lives with:: Adult Children Patient language and need for interpreter reviewed:: Yes Do you feel safe going back to the place where you live?: Yes       Need for Family Participation in Patient Care: Yes (Comment) Care giver support system in place?: Yes (comment)   Criminal Activity/Legal Involvement Pertinent to Current Situation/Hospitalization: No - Comment as needed  Activities of Daily Living Home Assistive Devices/Equipment: Cane (specify quad or straight)(single point cane) ADL Screening (condition at time of admission) Patient's cognitive ability adequate to safely complete daily activities?: No Is the patient deaf or have difficulty hearing?: No Does the patient have difficulty seeing, even when wearing glasses/contacts?: No Does the patient have difficulty concentrating, remembering, or making decisions?: Yes Patient able to express need for assistance with ADLs?: Yes Does the patient have difficulty dressing or bathing?: Yes Independently performs ADLs?: No Communication: Independent Dressing (OT): Needs assistance Is this a change from baseline?: Change from baseline, expected to last >3 days Grooming: Needs assistance Is this a change from baseline?: Change from baseline, expected to last >3 days Feeding: Needs assistance Is this a change from baseline?: Change from baseline, expected to last >3 days Bathing: Needs assistance Is this a change from baseline?: Change from baseline, expected to last >3 days Toileting: Dependent Is this a change from baseline?: Change from baseline, expected to last >3days In/Out Bed: Dependent Is this a change from baseline?: Change from baseline, expected to last >3 days [facility]  in Home: Dependent Is this a change from baseline?: Change from baseline, expected to last >3 days Does the patient have difficulty walking or climbing stairs?: Yes Weakness of Legs: Left Weakness of Arms/Hands: None  Permission Sought/Granted Permission sought to share information with : Family Supports    Share Information with NAME: Frank Gates, Frank Gates     Permission granted to share info w Relationship:  offspring     Emotional Assessment Appearance:: Appears stated age Attitude/Demeanor/Rapport: Unable to Assess Affect (typically observed): Unable to Assess Orientation: : Oriented to Self Alcohol / Substance Use: Not Applicable Psych Involvement: No (comment)  Admission diagnosis:  Hip fracture (HCC) [S72.009A] Fall [W19.XXXA] Closed fracture of left hip, initial encounter [facility]) [S72.002A] Patient Active Problem List   Diagnosis Date Noted  . Hip fracture (HCC) 11/21/2019  . PVC's (premature ventricular contractions) 04/26/2014  . Hypercholesterolemia 06/21/2013  . CAD (coronary artery disease) 05/29/2011  . HTN (hypertension) 05/29/2011  . Memory deficit 05/29/2011  . Abnormal CXR 05/29/2011  . Aortic stenosis 05/29/2011   PCP:  Merlene Laughter, MD Pharmacy:   [facility] [phone] [place], [place] - [phone] AT [facility] [address] [place] [address] Phone: [phone] Fax: [phone]     Social Determinants of Health (SDOH) Interventions    Readmission Risk Interventions No flowsheet data found.

## 2019-11-23 NOTE — Progress Notes (Signed)
OT Cancellation Note  Patient Details Name: Frank Gates. MRN: [id number] DOB: June 20, 1926   Cancelled Treatment:    Reason Eval/Treat Not Completed: Other (comment)  Fatigue/lethargy limiting ability to participate RN reports pt not awakening this morning.  Pt was up last night and given morphine early AM.  Will check back later today or next day  Lise Auer, [place] Acute Rehabilitation Services [phone] Office- [phone]     Alayjah Boehringer, [name] 11/23/2019, 10:12 AM

## 2019-11-23 NOTE — Progress Notes (Signed)
Subjective: 1 Day Post-Op Procedure(s) (LRB): left hip hemiarthroplasty vsTOTAL HIP ARTHROPLASTY ANTERIOR APPROACH (Left) Patient tolerated surgery well yesterday afternoon.  Left hip hemiarthroplasty performed.  Vitals and labs stable this am.  Resting.  Objective: Vital signs in last 24 hours: Temp:  [97.6 F (36.4 C)-99.3 F (37.4 C)] 97.7 F (36.5 C) (02/09 0442) Pulse Rate:  [68-103] 99 (02/09 0442) Resp:  [12-22] 18 (02/09 0442) BP: (97-122)/(66-96) 105/69 (02/09 0500) SpO2:  [93 %-100 %] 100 % (02/09 0442) Arterial Line BP: (126-134)/(64-71) 134/71 (02/08 1730)  Intake/Output from previous day: 02/08 0701 - 02/09 0700 In: 1911.9 [I.V.:1711.9; IV Piggyback:200] Out: 800 [Urine:600; Blood:200] Intake/Output this shift: No intake/output data recorded.  Recent Labs    11/21/19 1123 11/22/19 0536 11/23/19 0634  HGB 15.3 13.9 13.6   Recent Labs    11/22/19 0536 11/23/19 0634  WBC 9.6 8.4  RBC 4.61 4.48  HCT 43.8 43.4  PLT 157 141*   Recent Labs    11/22/19 0536 11/23/19 0634  NA 141 142  K 3.8 4.3  CL 110 113*  CO2 23 25  BUN 34* 32*  CREATININE 1.41* 1.41*  GLUCOSE 123* 115*  CALCIUM 9.4 9.0   No results for input(s): LABPT, INR in the last 72 hours.  Intact pulses distally Incision: dressing C/D/I   Assessment/Plan: 1 Day Post-Op Procedure(s) (LRB): left hip hemiarthroplasty vsTOTAL HIP ARTHROPLASTY ANTERIOR APPROACH (Left) Up with therapy WBAT left hip, no precautions Family would prefer the patient be at home post his hospital stay     Kathryne Hitch 11/23/2019, 7:30 AM

## 2019-11-23 NOTE — Discharge Instructions (Signed)

## 2019-11-23 NOTE — Progress Notes (Signed)
PT Cancellation Note  Patient Details Name: Frank Gates. MRN: [id number] DOB: 23-Jan-1926   Cancelled Treatment:    Reason Eval/Treat Not Completed: Fatigue/lethargy limiting ability to participate RN reports pt not awakening this morning.  Pt was up last night and given morphine early AM.  Will check back later today.   Paislie Tessler,KATHrine E 11/23/2019, 9:53 AM Thomasene Mohair PT, DPT Acute Rehabilitation Services Office: [phone]

## 2019-11-23 NOTE — Op Note (Signed)
NAME: Frank Gates, Frank Gates MEDICAL RECORD [id number] ACCOUNT [id number] DATE OF BIRTH:[birth year] FACILITY: [facility] LOCATION: [facility] PHYSICIAN:Mairead Schwarzkopf Aretha Parrot, MD  OPERATIVE REPORT  DATE OF PROCEDURE:  11/22/2019  PREOPERATIVE DIAGNOSIS:  Left hip displaced femoral neck fracture.  POSTOPERATIVE DIAGNOSIS:  Left hip displaced femoral neck fracture.  PROCEDURE:  Left hip hemiarthroplasty through direct anterior approach.  IMPLANTS:  DePuy Corail femoral component with standard offset size 15, size 53/25+5____ bipolar metal-on-metal hip ball.  SURGEON:  Vanita Panda. Magnus Ivan, MD  ASSISTANT:  Richardean Canal, PA-C  ANESTHESIA:  General.  ANTIBIOTICS:  Two g IV Ancef.  ESTIMATED BLOOD LOSS:  200 mL.  COMPLICATIONS:  None.  INDICATIONS:  The patient is a very pleasant 86 year old gentleman with dementia who does live with family.  He is a [employer] manager and unfortunately had a mechanical fall the day before yesterday, injuring his left hip.  He was brought to the  emergency room and found to have a displaced left hip femoral neck fracture.  Given the fact that he is having pain and does ambulate, we have recommended a hemiarthroplasty.  This is certainly to help decrease his pain and improve his mobility and  hopefully improve his quality of life.  The family is very reasonable and understand the risk of acute blood loss anemia, nerve or vessel injury, fracture, infection, dislocation, DVT and implant failure.  They understand our goals are to decrease pain,  improve mobility and overall improve quality of life.  DESCRIPTION OF PROCEDURE:  After informed consent was obtained and appropriate left hip was marked, he was brought to the operating room and was supine on the stretcher.  An arterial line was placed.  Of note, he had a preoperative echocardiogram that  shows ejection fraction of less than 20, but mild aortic stenosis.  They still felt that anesthesia was more  appropriate given the situation.  Once the general anesthesia was obtained, we placed traction boots on both his feet and placed him supine on  the Hana fracture table, the perineal post in place and both legs in line skeletal traction device and no traction applied.  His left operative hip was then prepped and draped with DuraPrep and sterile drapes.  A time-out was called.  He was identified,  correct patient, correct left hip.  We then made an incision just inferior and posterior to the anterior superior iliac spine and carried this obliquely down the leg.  We dissected down to tensor fascia lata muscle.  Tensor fascia was then divided  longitudinally to proceed with direct anterior approach to the hip.  We identified and cauterized circumflex vessels and identified the hip capsule, opened up the hip capsule, finding a large hemarthrosis consistent with his femoral neck fracture and you  could see the femoral neck was completely fractured and the head was rotated.  We placed Cobra retractors around the medial and lateral femoral neck remnants and then made our femoral neck cut just proximal to the lesser trochanter, but distal to the  fracture.  We made this with an oscillating saw and completed with an osteotome.  We then removed remnants of the fractured femoral neck.  I placed a corkscrew guide in the femoral head and removed the femoral head in its entirety.  We then took a  measurement off of this and we chose our 53 bipolar head.  We then cleaned the acetabulum of bony remnants.  The leg was then extended, externally rotated and adducted.   We were able  to place a Mueller retractor medially and a Hohman retractor behind  the greater trochanter, released the lateral joint capsule and used a box-cutting osteotome to enter the femoral canal and a rongeur to lateralize it.  We then began broaching using the Corail broaching system from a size 8 going up to a size 15.  With a  15 in place, we trialed a  standard offset femoral neck and then we tried our 53/28+1.5 hip ball, reduced this in the acetabulum and it was stable.  We felt like we needed just a little more offset and leg length and I wanted to definitely go a little  bit longer given his dementia and his inability to follow any type of hip precautions at all.  We dislocated the hip and removed the trial components.  We then placed the real size 15 femoral component, which was the Corail component with standard offset  and we went with the 53/28+5____  metal hip ball.  At this, we reduced the acetabulum and we were pleased with stability, range of motion, offset and leg length.  We irrigated the soft tissue then with normal saline solution.  We closed the joint  capsule with interrupted #1 Ethibond suture.  The tensor fascia was closed with #1 Vicryl, followed by 0 Vicryl to close the deep tissue, 2-0 Vicryl to close the subcutaneous tissue and staples were used to reapproximate the skin.  Xeroform and Aquacel  dressing was applied.  The patient was taken off the Hana table, awakened, extubated and taken to recovery room in stable condition.  All final counts were correct.  There were no complications noted.  Of note, Benita Stabile, PA-C, assisted during the entire  case and his assistance was crucial for facilitating all aspects of this case.  VN/NUANCE  D:11/22/2019 T:11/22/2019 JOB:[id number]

## 2019-11-23 NOTE — Evaluation (Signed)
Physical Therapy Evaluation Patient Details Name: Frank Gates. MRN: [id number] DOB: October 08, 1926 Today's Date: 11/23/2019   History of Present Illness  86 y.o. male with PMH significant for Alzheimer's dementia, HTN, HLD, CAD s/p CABG 2001, ischemic heart disease, mild systolic CHF, mild to moderate aortic stenosis. Patient presented secondary to fall, suffering a left hip fracture and s/p L hip hemiarthroplasty direct anterior approach  Clinical Impression  Patient is s/p above surgery resulting in functional limitations due to the deficits listed below (see PT Problem List).  Patient will benefit from skilled PT to increase their independence and safety with mobility to allow discharge to the venue listed below.  Pt requiring at least max assist +2 for mobility at this time and unable to pivot or shift feet today.  Pt's family plan for d/c home however pt would benefit from SNF if physical assist is not available at home.     Follow Up Recommendations SNF;Supervision/Assistance - 24 hour(family prefers home so HHPT)    Equipment Recommendations  Wheelchair cushion (measurements PT);Wheelchair (measurements PT);3in1 (PT)    Recommendations for Other Services       Precautions / Restrictions Precautions Precautions: Fall      Mobility  Bed Mobility Overal bed mobility: Needs Assistance Bed Mobility: Supine to Sit;Sit to Supine     Supine to sit: Total assist;+2 for physical assistance Sit to supine: Total assist;+2 for physical assistance   General bed mobility comments: provided multimodal cues and time however pt having difficulty ?processing and pain so requiring increased assist  Transfers Overall transfer level: Needs assistance Equipment used: Rolling walker (2 wheeled) Transfers: Sit to/from Stand Sit to Stand: Max assist;+2 physical assistance         General transfer comment: verbal cues for safe technique, increased assist to rise and steady; pt unable to  weight shift or move L LE, performing standing at EOB for about 3 minutes and pt fatigued quickly so returned to bed  Ambulation/Gait                Stairs            Wheelchair Mobility    Modified Rankin (Stroke Patients Only)       Balance Overall balance assessment: History of Falls                                           Pertinent Vitals/Pain Pain Assessment: Faces Faces Pain Scale: Hurts even more Pain Location: L hip with movement Pain Descriptors / Indicators: Grimacing;Guarding Pain Intervention(s): Monitored during session;Repositioned    Home Living Family/patient expects to be discharged to:: Private residence Living Arrangements: Children Available Help at Discharge: Family;Available 24 hours/day Type of Home: House Home Access: Stairs to enter   [employer] of Steps: 3-5 steps Home Layout: One level Home Equipment: Walker - 2 wheels      Prior Function Level of Independence: Needs assistance   Gait / Transfers Assistance Needed: using RW, hx dementia           Hand Dominance        Extremity/Trunk Assessment        Lower Extremity Assessment Lower Extremity Assessment: Generalized weakness;LLE deficits/detail;Difficult to assess due to impaired cognition LLE Deficits / Details: guarding L leg with assisted movement LLE: Unable to fully assess due to pain       Communication  Communication: No difficulties  Cognition Arousal/Alertness: Awake/alert Behavior During Therapy: Flat affect Overall Cognitive Status: History of cognitive impairments - at baseline                                 General Comments: hx dementia, family reports pt not quite at baseline (having more difficulty following commands)      General Comments      Exercises     Assessment/Plan    PT Assessment Patient needs continued PT services  PT Problem List Decreased strength;Decreased activity  tolerance;Decreased balance;Decreased knowledge of use of DME;Decreased mobility;Decreased safety awareness;Pain;Decreased cognition       PT Treatment Interventions DME instruction;Therapeutic exercise;Gait training;Balance training;Therapeutic activities;Functional mobility training;Patient/family education;Wheelchair mobility training    PT Goals (Current goals can be found in the Care Plan section)  Acute Rehab PT Goals PT Goal Formulation: With patient/family Time For Goal Achievement: 12/07/19 Potential to Achieve Goals: Fair    Frequency Min 5X/week   Barriers to discharge        Co-evaluation               AM-PAC PT "6 Clicks" Mobility  Outcome Measure Help needed turning from your back to your side while in a flat bed without using bedrails?: A Lot Help needed moving from lying on your back to sitting on the side of a flat bed without using bedrails?: A Lot Help needed moving to and from a bed to a chair (including a wheelchair)?: A Lot Help needed standing up from a chair using your arms (e.g., wheelchair or bedside chair)?: A Lot Help needed to walk in hospital room?: Total Help needed climbing 3-5 steps with a railing? : Total 6 Click Score: 10    End of Session Equipment Utilized During Treatment: Gait belt Activity Tolerance: Patient tolerated treatment well Patient left: with call bell/phone within reach;in bed;with bed alarm set;with family/visitor present   PT Visit Diagnosis: Other abnormalities of gait and mobility (R26.89)    Time: 4196-2229 PT Time Calculation (min) (ACUTE ONLY): 22 min   Charges:   PT Evaluation $PT Eval Low Complexity: 1 Low     Kati PT, DPT Acute Rehabilitation Services Office: [phone]   Trena Platt 11/23/2019, 4:13 PM

## 2019-11-23 NOTE — Progress Notes (Addendum)
PROGRESS NOTE    Christiane Ha.  NFA:[id number] DOB: December 30, 1925 DOA: 11/21/2019 PCP: Merlene Laughter, MD   Brief Narrative: Krosby Ritchie. is a 86 y.o. male with PMH significant for Alzheimer's dementia, HTN, HLD, CAD s/p CABG 2001, ischemic heart disease, mild systolic CHF, mild to moderate aortic stenosis. Patient presented secondary to fall, suffering a left hip fracture.   Assessment & Plan:   Active Problems:   CAD (coronary artery disease)   HTN (hypertension)   Aortic stenosis   Hypercholesterolemia   Hip fracture (HCC)   Hip fracture Left hip fracture suffered after a fall. Complicated by history of dementia. Patient is ambulatory at baseline with a cane. -Orthopedic surgery recommendations: surgery today -Pain management with Tylenol, Morphine IV -CBC daily; watch hemoglobin  Alzheimer's dementia Currently resides at home with his son and caregivers.  Essential hypertension Blood pressure mostly normotensive. Not on medications as an outpatient.  CAD Patient is s/p CABG x6 in 2001. Currently not on medication.  Hyperlipidemia Not on a statin.  CKD stage IIIb Stable.  Ischemic heart disease Acute systolic heart failure Last Transthoracic Echocardiogram from 2008 significant for low-normal LV function and impaired relaxation. Recent Transthoracic Echocardiogram from 11/22/19 with severely reduce EF of <20% which is new. Also with right ventricular function. Had discussion with daughter about this diagnosis and decision to have cardiology consult for proposed recommendations with the knowledge that we my or may not decide to treat further than just using diuretics. -Cardiology consult -Palliative care consult -Lasix 20 mg IV daily; may increase to 40 mg daily if diuretic effect not adequate overnight  Mild-moderate aortic stenosis Patient appears to be functional. Fall is likely secondary to ambulatory dysfunction but patient is a poor historian.  Acute  respiratory failure with hypoxia Obtained chest x-ray which was significant for vascular congestion. -Lasix as mentioned above -Wean to room air as able   DVT prophylaxis: SCDs Code Status:   Code Status: DNR Family Communication: None at bedside. Daughter on telephone Disposition Plan: Anticipate discharge home when medically stable. Family would prefer not to send patient to SNF as they have caregivers at home. PT recommendations pending   Consultants:   Orthopedic surgery  Procedures:    LEFT HIP HEMIARTHROPLASTY (11/22/2019)   TRANSTHORACIC ECHOCARDIOGRAM (11/22/2019) IMPRESSIONS    1. Left ventricular ejection fraction, by visual estimation, is <20%. The  left ventricle has severely decreased function. There is no left  ventricular hypertrophy. Indeterminant diastolic function.  2. Definity contrast agent was given IV to delineate the left ventricular  endocardial borders.  3. The left ventricle demonstrates global hypokinesis.  4. Global right ventricle has moderately reduced systolic function.The  right ventricular size is normal. No increase in right ventricular wall  thickness.  5. Left atrial size was mild-moderately dilated.  6. Right atrial size was mildly dilated.  7. Mild mitral annular calcification.  8. The mitral valve is normal in structure. Mild mitral valve  regurgitation. No evidence of mitral stenosis.  9. The tricuspid valve is normal in structure. Tricuspid valve  regurgitation is trivial.  10. The aortic valve is tricuspid. Aortic valve regurgitation is mild. At  lease mild aortic valve stenosis, mean gradient only measured at 5 mmHg  but suspect this is due to low EF.  11. TR signal is inadequate for assessing pulmonary artery systolic  pressure.   Antimicrobials:  None    Subjective: Agitation overnight.  Objective: Vitals:   11/22/19 2014 11/23/19 0442 11/23/19 0500  11/23/19 0812  BP: 121/81 97/66 105/69 (!) 106/92  Pulse:  100 99  (!) 104  Resp: 17 18  20   Temp: 97.7 F (36.5 C) 97.7 F (36.5 C)  98.7 F (37.1 C)  TempSrc: Axillary Oral  Oral  SpO2: 93% 100%  92%    Intake/Output Summary (Last 24 hours) at 11/23/2019 1000 Last data filed at 11/23/2019 0600 Gross per 24 hour  Intake 1911.88 ml  Output 800 ml  Net 1111.88 ml   There were no vitals filed for this visit.  Examination:  General exam: Appears calm and comfortable Respiratory system: Clear to auscultation. Respiratory effort normal. Cardiovascular system: S1 & S2 heard, RRR. No murmurs, rubs, gallops or clicks. Gastrointestinal system: Abdomen is nondistended, soft and nontender. No organomegaly or masses felt. Normal bowel sounds heard. Central nervous system: Sleeping, no distress. No focal neurological deficits. Extremities: No edema. No calf tenderness Skin: No cyanosis. No rashes   Data Reviewed: I have personally reviewed following labs and imaging studies  CBC: Recent Labs  Lab 11/21/19 1123 11/22/19 0536 11/23/19 0634  WBC 11.1* 9.6 8.4  NEUTROABS 9.6*  --   --   HGB 15.3 13.9 13.6  HCT 47.4 43.8 43.4  MCV 94.0 95.0 96.9  PLT 190 157 141*   Basic Metabolic Panel: Recent Labs  Lab 11/21/19 1123 11/22/19 0536 11/23/19 0634  NA 137 141 142  K 3.8 3.8 4.3  CL 105 110 113*  CO2 26 23 25   GLUCOSE 144* 123* 115*  BUN 37* 34* 32*  CREATININE 1.50* 1.41* 1.41*  CALCIUM 9.3 9.4 9.0   GFR: CrCl cannot be calculated (Unknown ideal weight.). Liver Function Tests: No results for input(s): AST, ALT, ALKPHOS, BILITOT, PROT, ALBUMIN in the last 168 hours. No results for input(s): LIPASE, AMYLASE in the last 168 hours. No results for input(s): AMMONIA in the last 168 hours. Coagulation Profile: No results for input(s): INR, PROTIME in the last 168 hours. Cardiac Enzymes: Recent Labs  Lab 11/21/19 1123  CKTOTAL 49   BNP (last 3 results) No results for input(s): PROBNP in the last 8760 hours. HbA1C: No results for  input(s): HGBA1C in the last 72 hours. CBG: No results for input(s): GLUCAP in the last 168 hours. Lipid Profile: No results for input(s): CHOL, HDL, LDLCALC, TRIG, CHOLHDL, LDLDIRECT in the last 72 hours. Thyroid Function Tests: No results for input(s): TSH, T4TOTAL, FREET4, T3FREE, THYROIDAB in the last 72 hours. Anemia Panel: No results for input(s): VITAMINB12, FOLATE, FERRITIN, TIBC, IRON, RETICCTPCT in the last 72 hours. Sepsis Labs: No results for input(s): PROCALCITON, LATICACIDVEN in the last 168 hours.  Recent Results (from the past 240 hour(s))  SARS CORONAVIRUS 2 (TAT 6-24 HRS) Nasopharyngeal Nasopharyngeal Swab     Status: None   Collection Time: 11/21/19 11:23 AM   Specimen: Nasopharyngeal Swab  Result Value Ref Range Status   SARS Coronavirus 2 NEGATIVE NEGATIVE Final    Comment: (NOTE) SARS-CoV-2 target nucleic acids are NOT DETECTED. The SARS-CoV-2 RNA is generally detectable in upper and lower respiratory specimens during the acute phase of infection. Negative results do not preclude SARS-CoV-2 infection, do not rule out co-infections with other pathogens, and should not be used as the sole basis for treatment or other patient management decisions. Negative results must be combined with clinical observations, patient history, and epidemiological information. The expected result is Negative. Fact Sheet for Patients: 01/19/20 Fact Sheet for Healthcare Providers: 01/19/20 This test is not yet approved or cleared by  the [employer] and  has been authorized for detection and/or diagnosis of SARS-CoV-2 by FDA under an Emergency Use Authorization (EUA). This EUA will remain  in effect (meaning this test can be used) for the duration of the COVID-19 declaration under Section 56 4(b)(1) of the Act, 21 U.S.C. section 360bbb-3(b)(1), unless the authorization is terminated or revoked sooner. Performed  at [facility], [place] [address], [place], [place] [address]   Urine culture     Status: Abnormal (Preliminary result)   Collection Time: 11/21/19 12:39 PM   Specimen: Urine, Random  Result Value Ref Range Status   Specimen Description   Final    URINE, RANDOM Performed at [facility] [address] [place], [place] [address]    Special Requests   Final    NONE Performed at [facility], [place] [address] [place], [place] [address]    Culture (A)  Final    >=100,000 COLONIES/mL GRAM NEGATIVE RODS IDENTIFICATION AND SUSCEPTIBILITIES TO FOLLOW Performed at [facility], [place] [address], [place], [place] [address]    Report Status PENDING  Incomplete  Surgical pcr screen     Status: Abnormal   Collection Time: 11/22/19  5:25 AM   Specimen: Nasal Mucosa; Nasal Swab  Result Value Ref Range Status   MRSA, PCR NEGATIVE NEGATIVE Final   Staphylococcus aureus POSITIVE (A) NEGATIVE Final    Comment: (NOTE) The Xpert SA Assay (FDA approved for NASAL specimens in patients 5 years of age and older), is one component of a comprehensive surveillance program. It is not intended to diagnose infection nor to guide or monitor treatment. Performed at [facility] : [facility], [place] [address] [place], [place] [address]          Radiology Studies: DG Knee 1-2 Views Left  Result Date: 11/21/2019 CLINICAL DATA:  86 year old who fell and complains of LEFT hip and LEFT UPPER leg pain. Initial encounter. EXAM: LEFT KNEE - 1-2 VIEW COMPARISON:  None. FINDINGS: No evidence of acute fracture or dislocation. Moderate MEDIAL and LATERAL compartment joint space narrowing. Patellofemoral joint space well-preserved. Enthesopathic spurring at the insertion of the quadriceps tendon on the SUPERIOR patella and at the insertion of the patellar tendon on the INFERIOR patella. No visible joint effusion. Femoropopliteal atherosclerosis. IMPRESSION: 1. No  acute osseous abnormality. 2. Moderate MEDIAL and LATERAL compartment osteoarthritis. Electronically Signed   By: Evangeline Dakin M.D.   On: 11/21/2019 12:30   CT Head Wo Contrast  Result Date: 11/21/2019 CLINICAL DATA:  Unwitnessed fall. EXAM: CT HEAD WITHOUT CONTRAST CT CERVICAL SPINE WITHOUT CONTRAST TECHNIQUE: Multidetector CT imaging of the head and cervical spine was performed following the standard protocol without intravenous contrast. Multiplanar CT image reconstructions of the cervical spine were also generated. COMPARISON:  October 25, 2010. FINDINGS: CT HEAD FINDINGS Brain: Mild diffuse cortical atrophy is noted. Mild chronic ischemic white matter disease is noted. No mass effect or midline shift is noted. Ventricular size is within normal limits. There is no evidence of mass lesion, hemorrhage or acute infarction. Vascular: No hyperdense vessel or unexpected calcification. Skull: Normal. Negative for fracture or focal lesion. Sinuses/Orbits: No acute finding. Other: None. CT CERVICAL SPINE FINDINGS Alignment: Normal. Skull base and vertebrae: No acute fracture. No primary bone lesion or focal pathologic process. Soft tissues and spinal canal: No prevertebral fluid or swelling. No visible canal hematoma. Disc levels: Mild degenerative disc disease is noted at C4-5, C5-6 and C6-7. Upper chest:  Negative. Other: Degenerative changes are seen involving posterior facet joints bilaterally. IMPRESSION: 1. Mild diffuse cortical atrophy. Mild chronic ischemic white matter disease. No acute intracranial abnormality seen. 2. Multilevel degenerative disc disease. No acute abnormality seen in the cervical spine. Electronically Signed   By: Lupita Raider M.D.   On: 11/21/2019 12:36   CT Cervical Spine Wo Contrast  Result Date: 11/21/2019 CLINICAL DATA:  Unwitnessed fall. EXAM: CT HEAD WITHOUT CONTRAST CT CERVICAL SPINE WITHOUT CONTRAST TECHNIQUE: Multidetector CT imaging of the head and cervical spine was  performed following the standard protocol without intravenous contrast. Multiplanar CT image reconstructions of the cervical spine were also generated. COMPARISON:  October 25, 2010. FINDINGS: CT HEAD FINDINGS Brain: Mild diffuse cortical atrophy is noted. Mild chronic ischemic white matter disease is noted. No mass effect or midline shift is noted. Ventricular size is within normal limits. There is no evidence of mass lesion, hemorrhage or acute infarction. Vascular: No hyperdense vessel or unexpected calcification. Skull: Normal. Negative for fracture or focal lesion. Sinuses/Orbits: No acute finding. Other: None. CT CERVICAL SPINE FINDINGS Alignment: Normal. Skull base and vertebrae: No acute fracture. No primary bone lesion or focal pathologic process. Soft tissues and spinal canal: No prevertebral fluid or swelling. No visible canal hematoma. Disc levels: Mild degenerative disc disease is noted at C4-5, C5-6 and C6-7. Upper chest: Negative. Other: Degenerative changes are seen involving posterior facet joints bilaterally. IMPRESSION: 1. Mild diffuse cortical atrophy. Mild chronic ischemic white matter disease. No acute intracranial abnormality seen. 2. Multilevel degenerative disc disease. No acute abnormality seen in the cervical spine. Electronically Signed   By: Lupita Raider M.D.   On: 11/21/2019 12:36   Pelvis Portable  Result Date: 11/22/2019 CLINICAL DATA:  Left hip arthroplasty EXAM: PORTABLE PELVIS 1-2 VIEWS COMPARISON:  11/22/2019 FINDINGS: Single frontal view of the pelvis excludes the iliac crests by collimation. Left hip hemiarthroplasty is identified in expected position without signs of acute complication. Postsurgical changes are seen within the soft tissues. IMPRESSION: 1. Left hip hemiarthroplasty as above. Electronically Signed   By: Sharlet Salina M.D.   On: 11/22/2019 17:15   DG Chest Portable 1 View  Result Date: 11/21/2019 CLINICAL DATA:  Preop. EXAM: PORTABLE CHEST 1 VIEW  COMPARISON:  May 29, 2011. FINDINGS: Mild cardiomegaly is noted. No pneumothorax or pleural effusion is noted. Status post coronary bypass graft. Both lungs are clear. The visualized skeletal structures are unremarkable. IMPRESSION: No active disease. Electronically Signed   By: Lupita Raider M.D.   On: 11/21/2019 13:52   DG C-Arm 1-60 Min-No Report  Result Date: 11/22/2019 Fluoroscopy was utilized by the requesting physician.  No radiographic interpretation.   ECHOCARDIOGRAM COMPLETE  Result Date: 11/22/2019    ECHOCARDIOGRAM REPORT   Patient Name:   Que Laduca. Date of Exam: 11/22/2019 Medical Rec #:  [id number]         Height:       72.0 in Accession #:    [id number]        Weight:       181.8 lb Date of Birth:  05-14-1926         BSA:          2.05 m Patient Age:    89 years          BP:           139/100 mmHg Patient Gender: M  HR:           85 bpm. Exam Location:  Inpatient Procedure: 2D Echo and Intracardiac Opacification Agent Indications:    CHF-Acute Diastolic 428.31 / I50.31  History:        Patient has prior history of Echocardiogram examinations, most                 recent 04/29/2007. CAD, Prior CABG; Risk Factors:Hypertension and                 Dyslipidemia. Alzheimer's dementia, Left femoral fracture.  Sonographer:    Leta Junglingiffany Cooper RDCS Referring Phys: [id number] [facility] HospitalBINAYA DAHAL IMPRESSIONS  1. Left ventricular ejection fraction, by visual estimation, is <20%. The left ventricle has severely decreased function. There is no left ventricular hypertrophy. Indeterminant diastolic function.  2. Definity contrast agent was given IV to delineate the left ventricular endocardial borders.  3. The left ventricle demonstrates global hypokinesis.  4. Global right ventricle has moderately reduced systolic function.The right ventricular size is normal. No increase in right ventricular wall thickness.  5. Left atrial size was mild-moderately dilated.  6. Right atrial size was mildly dilated.   7. Mild mitral annular calcification.  8. The mitral valve is normal in structure. Mild mitral valve regurgitation. No evidence of mitral stenosis.  9. The tricuspid valve is normal in structure. Tricuspid valve regurgitation is trivial. 10. The aortic valve is tricuspid. Aortic valve regurgitation is mild. At lease mild aortic valve stenosis, mean gradient only measured at 5 mmHg but suspect this is due to low EF. 11. TR signal is inadequate for assessing pulmonary artery systolic pressure. FINDINGS  Left Ventricle: Left ventricular ejection fraction, by visual estimation, is <20%. The left ventricle has severely decreased function. The left ventricle demonstrates global hypokinesis. Definity contrast agent was given IV to delineate the left ventricular endocardial borders. The left ventricular internal cavity size was the left ventricle is normal in size. There is no left ventricular hypertrophy. Right Ventricle: The right ventricular size is normal. No increase in right ventricular wall thickness. Global RV systolic function is has moderately reduced systolic function. Left Atrium: Left atrial size was mild-moderately dilated. Right Atrium: Right atrial size was mildly dilated Pericardium: There is no evidence of pericardial effusion. Mitral Valve: The mitral valve is normal in structure. There is mild calcification of the mitral valve leaflet(s). Mild mitral annular calcification. Mild mitral valve regurgitation. No evidence of mitral valve stenosis by observation. Tricuspid Valve: The tricuspid valve is normal in structure. Tricuspid valve regurgitation is trivial. Aortic Valve: The aortic valve is tricuspid. Aortic valve regurgitation is mild. Mild aortic stenosis is present. Aortic valve mean gradient measures 4.5 mmHg. Aortic valve peak gradient measures 7.2 mmHg. Aortic valve area, by VTI measures 1.16 cm. Pulmonic Valve: The pulmonic valve was normal in structure. Pulmonic valve regurgitation is not  visualized. Aorta: The aortic root is normal in size and structure. Venous: The inferior vena cava was not well visualized. IAS/Shunts: No atrial level shunt detected by color flow Doppler.  LEFT VENTRICLE PLAX 2D LVIDd:         5.30 cm       Diastology LVIDs:         4.80 cm       LV e' lateral:   6.86 cm/s LV PW:         1.00 cm       LV E/e' lateral: 5.8 LV IVS:        1.10 cm  LV e' medial:    8.00 cm/s LVOT diam:     2.00 cm       LV E/e' medial:  5.0 LV SV:         22.65 ml LV SV Index:   13.56 LVOT Area:     3.14 cm  LV Volumes (MOD) LV area d, A2C:    47.30 cm LV area d, A4C:    36.50 cm LV area s, A2C:    36.50 cm LV area s, A4C:    27.70 cm LV major d, A2C:   8.93 cm LV major d, A4C:   7.45 cm LV major s, A2C:   7.53 cm LV major s, A4C:   6.41 cm LV vol d, MOD A2C: 204.0 ml LV vol d, MOD A4C: 143.0 ml LV vol s, MOD A2C: 142.0 ml LV vol s, MOD A4C: 98.3 ml LV SV MOD A2C:     62.0 ml LV SV MOD A4C:     143.0 ml LV SV MOD BP:      59.0 ml RIGHT VENTRICLE RV S prime:     5.70 cm/s TAPSE (M-mode): 0.9 cm LEFT ATRIUM             Index       RIGHT ATRIUM           Index LA diam:        5.00 cm 2.44 cm/m  RA Area:     21.80 cm LA Vol (A2C):   84.2 ml 41.16 ml/m RA Volume:   65.90 ml  32.21 ml/m LA Vol (A4C):   69.8 ml 34.12 ml/m LA Biplane Vol: 80.1 ml 39.15 ml/m  AORTIC VALVE AV Area (Vmax):    1.10 cm AV Area (Vmean):   0.90 cm AV Area (VTI):     1.16 cm AV Vmax:           134.00 cm/s AV Vmean:          97.950 cm/s AV VTI:            0.194 m AV Peak Grad:      7.2 mmHg AV Mean Grad:      4.5 mmHg LVOT Vmax:         46.80 cm/s LVOT Vmean:        28.200 cm/s LVOT VTI:          0.072 m LVOT/AV VTI ratio: 0.37  AORTA Ao Root diam: 2.60 cm MITRAL VALVE MV Area (PHT): 4.17 cm            SHUNTS MV Decel Time: 182 msec            Systemic VTI:  0.07 m MV E velocity: 39.75 cm/s 103 cm/s Systemic Diam: 2.00 cm Marca Ancona MD Electronically signed by Marca Ancona MD Signature Date/Time:  11/22/2019/2:31:25 PM    Final    DG HIP OPERATIVE UNILAT W OR W/O PELVIS LEFT  Result Date: 11/22/2019 CLINICAL DATA:  86 year old male status post left hip arthroplasty. EXAM: OPERATIVE left HIP (WITH PELVIS IF PERFORMED) 1 VIEWS TECHNIQUE: Fluoroscopic spot image(s) were submitted for interpretation post-operatively. COMPARISON:  Left hip radiograph dated 11/21/2019. FINDINGS: Spot fluoroscopic intraoperative images provided. The total fluoroscopic time is 6 seconds. There is a left hip arthroplasty. No immediate complication. IMPRESSION: Left hip arthroplasty. Electronically Signed   By: Elgie Collard M.D.   On: 11/22/2019 17:00   DG Hip Unilat W or Wo Pelvis 2-3 Views Left  Result  Date: 11/21/2019 CLINICAL DATA:  86 year old who fell and complains of LEFT hip and LEFT UPPER leg pain. Initial encounter. EXAM: DG HIP (WITH OR WITHOUT PELVIS) 2-3V LEFT COMPARISON:  None. FINDINGS: Acute subcapital or basicervical LEFT femoral neck fracture. LEFT hip joint anatomically aligned with well-preserved joint space for age. Bone mineral density well preserved for age. Included AP pelvis demonstrates no fractures elsewhere. Well preserved joint space in the contralateral RIGHT hip. Sacroiliac joints anatomically aligned without diastasis, demonstrating only mild degenerative changes. Symphysis pubis anatomically aligned without diastasis or degenerative changes. IMPRESSION: Acute subcapital or basicervical LEFT femoral neck fracture. Electronically Signed   By: Hulan Saashomas  Lawrence M.D.   On: 11/21/2019 12:28   DG Femur Min 2 Views Left  Result Date: 11/21/2019 CLINICAL DATA:  86 year old who fell and complains of LEFT hip and LEFT UPPER leg pain. Initial encounter. EXAM: LEFT FEMUR 2 VIEWS COMPARISON:  None. FINDINGS: Subcapital/basicervical LEFT femoral neck fracture as detailed on the report of the concurrent LEFT hip images. No fractures elsewhere involving the femur. Well-preserved bone mineral density.  Femoropopliteal atherosclerosis. IMPRESSION: Subcapital/basicervical LEFT femoral neck fracture as detailed on the report of the concurrent LEFT hip images. No fractures elsewhere involving the femur. Electronically Signed   By: Hulan Saashomas  Lawrence M.D.   On: 11/21/2019 12:32        Scheduled Meds: . aspirin EC  325 mg Oral Q breakfast  . docusate sodium  100 mg Oral BID  . senna  1 tablet Oral BID  . sodium chloride flush  3 mL Intravenous Q12H   Continuous Infusions: . sodium chloride 50 mL/hr at 11/22/19 2337  . lactated ringers 50 mL/hr at 11/22/19 1333     LOS: 2 days     Jacquelin Hawkingalph Jessamy Torosyan, MD [facility] 11/23/2019, 10:00 AM  If 7PM-7AM, please contact night-coverage [email or web address]

## 2019-11-24 ENCOUNTER — Inpatient Hospital Stay (HOSPITAL_COMMUNITY): Payer: Medicare Other

## 2019-11-24 ENCOUNTER — Telehealth: Payer: Self-pay

## 2019-11-24 DIAGNOSIS — I2510 Atherosclerotic heart disease of native coronary artery without angina pectoris: Secondary | ICD-10-CM

## 2019-11-24 DIAGNOSIS — W19XXXA Unspecified fall, initial encounter: Secondary | ICD-10-CM

## 2019-11-24 DIAGNOSIS — I5021 Acute systolic (congestive) heart failure: Secondary | ICD-10-CM

## 2019-11-24 LAB — CBC
HCT: 37.3 % — ABNORMAL LOW (ref 39.0–52.0)
Hemoglobin: 12.1 g/dL — ABNORMAL LOW (ref 13.0–17.0)
MCH: 31.2 pg (ref 26.0–34.0)
MCHC: 32.4 g/dL (ref 30.0–36.0)
MCV: 96.1 fL (ref 80.0–100.0)
Platelets: 140 10*3/uL — ABNORMAL LOW (ref 150–400)
RBC: 3.88 MIL/uL — ABNORMAL LOW (ref 4.22–5.81)
RDW: 13.1 % (ref 11.5–15.5)
WBC: 10.3 10*3/uL (ref 4.0–10.5)
nRBC: 0 % (ref 0.0–0.2)

## 2019-11-24 LAB — BASIC METABOLIC PANEL
Anion gap: 5 (ref 5–15)
BUN: 34 mg/dL — ABNORMAL HIGH (ref 8–23)
CO2: 25 mmol/L (ref 22–32)
Calcium: 9.1 mg/dL (ref 8.9–10.3)
Chloride: 114 mmol/L — ABNORMAL HIGH (ref 98–111)
Creatinine, Ser: 1.37 mg/dL — ABNORMAL HIGH (ref 0.61–1.24)
GFR calc Af Amer: 52 mL/min — ABNORMAL LOW (ref >60)
GFR calc non Af Amer: 45 mL/min — ABNORMAL LOW (ref >60)
Glucose, Bld: 124 mg/dL — ABNORMAL HIGH (ref 70–99)
Potassium: 3.6 mmol/L (ref 3.5–5.1)
Sodium: 144 mmol/L (ref 135–145)

## 2019-11-24 LAB — URINE CULTURE: Culture: >=100000 — AB

## 2019-11-24 MED ORDER — LIP MEDEX EX OINT
TOPICAL_OINTMENT | CUTANEOUS | Status: AC
  Filled 2019-11-24: qty 7

## 2019-11-24 NOTE — Progress Notes (Signed)
PT Cancellation Note  Patient Details Name: Frank Gates. MRN: [id number] DOB: 08/09/1926   Cancelled Treatment:     first X ray hip in am then awaiting CT.  Will attempt to see tomorrow with family for education on safe handling and care.    Felecia Shelling  PTA Acute  Rehabilitation Services Pager      [phone] Office      [phone]

## 2019-11-24 NOTE — Progress Notes (Signed)
OT Cancellation Note  Patient Details Name: Frank Gates. MRN: [id number] DOB: [birth year]   Cancelled Treatment:    Reason Eval/Treat Not Completed: Medical issues which prohibited therapy  Awaiting  Xray              Lise Auer, OT Acute Rehabilitation Services [phone] Office- [phone]     [name], [name] 11/24/2019, 11:35 AM

## 2019-11-24 NOTE — Progress Notes (Signed)
RN noted left lower extremity, left hip region to be red, mild edematous and warm to touch. Moderate sanguinous drainage noted at incision site dressing.  Left leg internally rotated inward. Surgeon, Dr. Amie Critchley, notified. Received verbal order for Left hip portable xray. Order placed. Will continue to monitor.

## 2019-11-24 NOTE — Progress Notes (Signed)
Patient ID: Frank Gates., male   DOB: [birth year], 86 y.o.   MRN: [id number] Called because of concern that left hip had dislocated. Radiographs obtained today show the left hip to be well located and with expected findings status post left hip hemiarthroplasty.    Physical Exam :  General:Appears comfortable.  Left leg internally rotated. Obvious discomfort with range of motion but motion is fluid. No gross deformity of left thigh and lower leg. Dressing with scant drainage. Otherwise expected findings about left hip incision site.   Assessment / Plan:POD# 2s/p left hip hemiarthroplasty Knee immobilizer left leg at all times.  Obtain CT left hip rule out fracture. Hold PT until after CT scan.

## 2019-11-24 NOTE — Consult Note (Addendum)
Cardiology Consultation:   Patient ID: Frank Gates.; [id number]; 1925/12/02   Admit date: 11/21/2019 Date of Consult: 11/24/2019  Primary Care Provider: Merlene Laughter, MD Primary Cardiologist: Remote-Dr. Patty Sermons (08/2015)  Patient Profile:   Frank Gates. is a 86 y.o. male with a hx of Alzheimer's dementia, CAD s/p CABG 2001, essential hypertension, mild to moderate aortic stenosis per echocardiogram 04/2007 and hyperlipidemia who is being seen today for the evaluation of new severely reduced LV function at 20% at the request of Dr. Caleb Popp.  History of Present Illness:   [name] is a 86 year old male with history stated above who presented to [facility] after sustaining a mechanical fall 11/20/2019 at his home.  HPI obtained from chart review and RN given patient's significant history of Alzheimer's dementia.  Patient lives at home with his son. He apparently is relatively functional at baseline and walks only with the assistance of a cane within the home. He unfortunately sustained an unwitnessed fall one day prior to hospital presentation. Given significant complaints of left leg pain after the fall with minimal weightbearing, he was brought to the ED for further evaluation.  In the ED, imaging revealed acute left femoral neck fracture at which time the orthopedic team was consulted with plans for left total hip versus hemiarthroplasty on 11/22/2019. Given his history of coronary artery disease, an echocardiogram was ordered on admission for pre-operative evaluation given length of time since last assessment. Unfortunately this showed a severely reduced LVEF at less than 20% with indeterminate diastolic dysfunction, left ventricular global hypokinesis, mild aortic valve stenosis with a mean gradient of 5 mmHg. Given no cardiovascular complaints, patient was taken to the operating room for fracture repair on 11/22/2019 without acute complication. Cardiology has been asked to  evaluate given new echocardiogram findings.  On my interview today, the patient is is pleasantly confused however denies CV symptoms of chest pain.  RN at bedside states he will get substantially short of breath while working with PT.  On chart review, it appears that he has had no CV symptoms while ambulating at home.  Appears his only home medication at this time is p.o. Lasix.  He has not been seen by cardiology service since 2016 and appears to have been fairly stable without hospital admissions.  Plan is for patient to return home with home health RN, PT, OT assistance as well as home rehabilitation services.  Past Medical History:  Diagnosis Date  . Aortic stenosis    Mild to moderate per echo in 2008  . Asymptomatic PVCs   . Hyperlipidemia   . Hypertension   . IHD (ischemic heart disease)    s/p CABG in 2001  . LV dysfunction    EF 50% per nuclear in 2008  . Memory loss   . PAC (premature atrial contraction)     Past Surgical History:  Procedure Laterality Date  . CARDIAC CATHETERIZATION  04/10/2000   EF 44-45%  . CARDIOVASCULAR STRESS TEST  08/24/2007   EF 50%  . CORONARY ARTERY BYPASS GRAFT  2001  . TONSILLECTOMY AND ADENOIDECTOMY    . TOTAL HIP ARTHROPLASTY Left 11/22/2019   Procedure: left hip hemiarthroplasty vsTOTAL HIP ARTHROPLASTY ANTERIOR APPROACH;  Surgeon: Kathryne Hitch, MD;  Location: [facility];  Service: Orthopedics;  Laterality: Left;  . US ECHOCARDIOGRAPHY  04/29/2007   EF 55-60%     Prior to Admission medications   Medication Sig Start Date End Date Taking? Authorizing Provider  furosemide (LASIX) 20 MG tablet Take 20 mg by mouth daily. 10/19/19  Yes [provider]    Inpatient Medications: Scheduled Meds: . aspirin EC  325 mg Oral Q breakfast  . docusate sodium  100 mg Oral BID  . furosemide  20 mg Intravenous Daily  . senna  1 tablet Oral BID  . sodium chloride flush  3 mL Intravenous Q12H   Continuous Infusions: . lactated ringers 50  mL/hr at 11/22/19 1333   PRN Meds: acetaminophen, haloperidol lactate, HYDROcodone-acetaminophen, HYDROcodone-acetaminophen, menthol-cetylpyridinium **OR** phenol, metoCLOPramide **OR** metoCLOPramide (REGLAN) injection, morphine injection, morphine injection, ondansetron **OR** ondansetron (ZOFRAN) IV, sodium phosphate, sorbitol  Allergies:   No Known Allergies  Social History:   Social History   Socioeconomic History  . Marital status: Married    Spouse name: Not on file  . Number of children: Not on file  . Years of education: Not on file  . Highest education level: Not on file  Occupational History  . Not on file  Tobacco Use  . Smoking status: Never Smoker  . Smokeless tobacco: Never Used  Substance and Sexual Activity  . Alcohol use: No  . Drug use: No  . Sexual activity: Not on file  Other Topics Concern  . Not on file  Social History Narrative  . Not on file   Social Determinants of Health   Financial Resource Strain:   . Difficulty of Paying Living Expenses: Not on file  Food Insecurity:   . Worried About Programme researcher, broadcasting/film/video in the Last Year: Not on file  . Ran Out of Food in the Last Year: Not on file  Transportation Needs:   . Lack of Transportation (Medical): Not on file  . Lack of Transportation (Non-Medical): Not on file  Physical Activity:   . Days of Exercise per Week: Not on file  . Minutes of Exercise per Session: Not on file  Stress:   . Feeling of Stress : Not on file  Social Connections:   . Frequency of Communication with Friends and Family: Not on file  . Frequency of Social Gatherings with Friends and Family: Not on file  . Attends Religious Services: Not on file  . Active Member of Clubs or Organizations: Not on file  . Attends Banker Meetings: Not on file  . Marital Status: Not on file  Intimate Partner Violence:   . Fear of Current or Ex-Partner: Not on file  . Emotionally Abused: Not on file  . Physically Abused: Not on  file  . Sexually Abused: Not on file    Family History:   Family History  Problem Relation Age of Onset  . Stroke Mother   . Heart failure Father    Family Status:  Family Status  Relation Name Status  . Mother  Deceased at age 52  . Father  Deceased at age 23  . Sister  Alive    ROS:  Please see the history of present illness.  All other ROS reviewed and negative.     Physical Exam/Data:   Vitals:   11/23/19 1254 11/23/19 1600 11/23/19 2046 11/24/19 0533  BP:  101/78 107/73 90/65  Pulse: (!) 104 92 (!) 102 91  Resp: (!) 24 20 20 19   Temp:  98.8 F (37.1 C) 98.8 F (37.1 C) 98 F (36.7 C)  TempSrc:   Oral Oral  SpO2:   98% 97%  Weight:      Height:  Intake/Output Summary (Last 24 hours) at 11/24/2019 0857 Last data filed at 11/24/2019 0851 Gross per 24 hour  Intake 120 ml  Output 1350 ml  Net -1230 ml   Filed Weights   11/23/19 1112  Weight: 82.5 kg   Body mass index is 24.66 kg/m.   General: Elderly, NAD  Head: Normocephalic, atraumatic, clear, moist mucus membranes. Lungs:Clear to ausculation bilaterally. No wheezes, rales, or rhonchi. Breathing is unlabored. Cardiovascular: RRR with S1 S2. No murmurs Abdomen: Soft, non-tender, non-distended. No obvious abdominal masses. Extremities: No edema. DP pulses 2+ bilaterally Neuro: Alert and disoriented to place and situation. No focal deficits. No facial asymmetry. MAE spontaneously. Psych: Responds to questions somewhat appropriately with normal affect.     EKG:  The EKG was personally reviewed and demonstrates: 11/21/2019, sinus tachycardia HR 107 bpm with PVCs Relevant CV Studies:  Echocardiogram 11/22/2019:  1. Left ventricular ejection fraction, by visual estimation, is <20%. The  left ventricle has severely decreased function. There is no left  ventricular hypertrophy. Indeterminant diastolic function.  2. Definity contrast agent was given IV to delineate the left ventricular  endocardial  borders.  3. The left ventricle demonstrates global hypokinesis.  4. Global right ventricle has moderately reduced systolic function.The  right ventricular size is normal. No increase in right ventricular wall  thickness.  5. Left atrial size was mild-moderately dilated.  6. Right atrial size was mildly dilated.  7. Mild mitral annular calcification.  8. The mitral valve is normal in structure. Mild mitral valve  regurgitation. No evidence of mitral stenosis.  9. The tricuspid valve is normal in structure. Tricuspid valve  regurgitation is trivial.  10. The aortic valve is tricuspid. Aortic valve regurgitation is mild. At  lease mild aortic valve stenosis, mean gradient only measured at 5 mmHg  but suspect this is due to low EF.  11. TR signal is inadequate for assessing pulmonary artery systolic  pressure.   Laboratory Data:  Chemistry Recent Labs  Lab 11/22/19 0536 11/23/19 0634 11/24/19 0445  NA 141 142 144  K 3.8 4.3 3.6  CL 110 113* 114*  CO2 23 25 25   GLUCOSE 123* 115* 124*  BUN 34* 32* 34*  CREATININE 1.41* 1.41* 1.37*  CALCIUM 9.4 9.0 9.1  GFRNONAA 43* 43* 45*  GFRAA 50* 50* 52*  ANIONGAP 8 4* 5    Total Protein  Date Value Ref Range Status  08/28/2015 6.6 6.1 - 8.1 g/dL Final   Albumin  Date Value Ref Range Status  08/28/2015 4.0 3.6 - 5.1 g/dL Final   AST  Date Value Ref Range Status  08/28/2015 15 10 - 35 U/L Final   ALT  Date Value Ref Range Status  08/28/2015 11 9 - 46 U/L Final   Alkaline Phosphatase  Date Value Ref Range Status  08/28/2015 76 40 - 115 U/L Final   Total Bilirubin  Date Value Ref Range Status  08/28/2015 0.6 0.2 - 1.2 mg/dL Final   Hematology Recent Labs  Lab 11/22/19 0536 11/23/19 0634 11/24/19 0445  WBC 9.6 8.4 10.3  RBC 4.61 4.48 3.88*  HGB 13.9 13.6 12.1*  HCT 43.8 43.4 37.3*  MCV 95.0 96.9 96.1  MCH 30.2 30.4 31.2  MCHC 31.7 31.3 32.4  RDW 13.2 13.1 13.1  PLT 157 141* 140*   Cardiac EnzymesNo  results for input(s): TROPONINI in the last 168 hours. No results for input(s): TROPIPOC in the last 168 hours.  BNPNo results for input(s): BNP, PROBNP in the last  168 hours.  DDimer No results for input(s): DDIMER in the last 168 hours. TSH: No results found for: TSH Lipids: Lab Results  Component Value Date   CHOL 147 08/28/2015   HDL 40 08/28/2015   LDLCALC 94 08/28/2015   TRIG 65 08/28/2015   CHOLHDL 3.7 08/28/2015   HgbA1c: Lab Results  Component Value Date   HGBA1C 5.6 05/29/2011    Radiology/Studies:  DG Knee 1-2 Views Left  Result Date: 11/21/2019 CLINICAL DATA:  86 year old who fell and complains of LEFT hip and LEFT UPPER leg pain. Initial encounter. EXAM: LEFT KNEE - 1-2 VIEW COMPARISON:  None. FINDINGS: No evidence of acute fracture or dislocation. Moderate MEDIAL and LATERAL compartment joint space narrowing. Patellofemoral joint space well-preserved. Enthesopathic spurring at the insertion of the quadriceps tendon on the SUPERIOR patella and at the insertion of the patellar tendon on the INFERIOR patella. No visible joint effusion. Femoropopliteal atherosclerosis. IMPRESSION: 1. No acute osseous abnormality. 2. Moderate MEDIAL and LATERAL compartment osteoarthritis. Electronically Signed   By: Hulan Saashomas  Lawrence M.D.   On: 11/21/2019 12:30   CT Head Wo Contrast  Result Date: 11/21/2019 CLINICAL DATA:  Unwitnessed fall. EXAM: CT HEAD WITHOUT CONTRAST CT CERVICAL SPINE WITHOUT CONTRAST TECHNIQUE: Multidetector CT imaging of the head and cervical spine was performed following the standard protocol without intravenous contrast. Multiplanar CT image reconstructions of the cervical spine were also generated. COMPARISON:  October 25, 2010. FINDINGS: CT HEAD FINDINGS Brain: Mild diffuse cortical atrophy is noted. Mild chronic ischemic white matter disease is noted. No mass effect or midline shift is noted. Ventricular size is within normal limits. There is no evidence of mass lesion,  hemorrhage or acute infarction. Vascular: No hyperdense vessel or unexpected calcification. Skull: Normal. Negative for fracture or focal lesion. Sinuses/Orbits: No acute finding. Other: None. CT CERVICAL SPINE FINDINGS Alignment: Normal. Skull base and vertebrae: No acute fracture. No primary bone lesion or focal pathologic process. Soft tissues and spinal canal: No prevertebral fluid or swelling. No visible canal hematoma. Disc levels: Mild degenerative disc disease is noted at C4-5, C5-6 and C6-7. Upper chest: Negative. Other: Degenerative changes are seen involving posterior facet joints bilaterally. IMPRESSION: 1. Mild diffuse cortical atrophy. Mild chronic ischemic white matter disease. No acute intracranial abnormality seen. 2. Multilevel degenerative disc disease. No acute abnormality seen in the cervical spine. Electronically Signed   By: Lupita RaiderJames  Green Jr M.D.   On: 11/21/2019 12:36   CT Cervical Spine Wo Contrast  Result Date: 11/21/2019 CLINICAL DATA:  Unwitnessed fall. EXAM: CT HEAD WITHOUT CONTRAST CT CERVICAL SPINE WITHOUT CONTRAST TECHNIQUE: Multidetector CT imaging of the head and cervical spine was performed following the standard protocol without intravenous contrast. Multiplanar CT image reconstructions of the cervical spine were also generated. COMPARISON:  October 25, 2010. FINDINGS: CT HEAD FINDINGS Brain: Mild diffuse cortical atrophy is noted. Mild chronic ischemic white matter disease is noted. No mass effect or midline shift is noted. Ventricular size is within normal limits. There is no evidence of mass lesion, hemorrhage or acute infarction. Vascular: No hyperdense vessel or unexpected calcification. Skull: Normal. Negative for fracture or focal lesion. Sinuses/Orbits: No acute finding. Other: None. CT CERVICAL SPINE FINDINGS Alignment: Normal. Skull base and vertebrae: No acute fracture. No primary bone lesion or focal pathologic process. Soft tissues and spinal canal: No prevertebral  fluid or swelling. No visible canal hematoma. Disc levels: Mild degenerative disc disease is noted at C4-5, C5-6 and C6-7. Upper chest: Negative. Other: Degenerative changes are seen  involving posterior facet joints bilaterally. IMPRESSION: 1. Mild diffuse cortical atrophy. Mild chronic ischemic white matter disease. No acute intracranial abnormality seen. 2. Multilevel degenerative disc disease. No acute abnormality seen in the cervical spine. Electronically Signed   By: Lupita Raider M.D.   On: 11/21/2019 12:36   Pelvis Portable  Result Date: 11/22/2019 CLINICAL DATA:  Left hip arthroplasty EXAM: PORTABLE PELVIS 1-2 VIEWS COMPARISON:  11/22/2019 FINDINGS: Single frontal view of the pelvis excludes the iliac crests by collimation. Left hip hemiarthroplasty is identified in expected position without signs of acute complication. Postsurgical changes are seen within the soft tissues. IMPRESSION: 1. Left hip hemiarthroplasty as above. Electronically Signed   By: Sharlet Salina M.D.   On: 11/22/2019 17:15   DG CHEST PORT 1 VIEW  Result Date: 11/23/2019 CLINICAL DATA:  Sudden onset of tachypnea. Hypertension. EXAM: PORTABLE CHEST 1 VIEW COMPARISON:  Radiograph 11/21/2019 FINDINGS: Post median sternotomy and CABG. Stable cardiomegaly and mediastinal contours. Aortic atherosclerosis. Mild vascular congestion without overt edema. Questionable retrocardiac opacity versus overlapping soft tissue structures. No pneumothorax. No acute osseous abnormalities are seen. IMPRESSION: Development of vascular congestion over the past 2 days. Cardiomegaly is stable. Questionable retrocardiac opacity/pleural effusion versus overlapping soft tissue structures. Electronically Signed   By: Narda Rutherford M.D.   On: 11/23/2019 14:13   DG Chest Portable 1 View  Result Date: 11/21/2019 CLINICAL DATA:  Preop. EXAM: PORTABLE CHEST 1 VIEW COMPARISON:  May 29, 2011. FINDINGS: Mild cardiomegaly is noted. No pneumothorax or pleural  effusion is noted. Status post coronary bypass graft. Both lungs are clear. The visualized skeletal structures are unremarkable. IMPRESSION: No active disease. Electronically Signed   By: Lupita Raider M.D.   On: 11/21/2019 13:52   DG C-Arm 1-60 Min-No Report  Result Date: 11/22/2019 Fluoroscopy was utilized by the requesting physician.  No radiographic interpretation.   ECHOCARDIOGRAM COMPLETE  Result Date: 11/22/2019    ECHOCARDIOGRAM REPORT   Patient Name:   Frank Gates. Date of Exam: 11/22/2019 Medical Rec #:  [id number]         Height:       72.0 in Accession #:    [id number]        Weight:       181.8 lb Date of Birth:  1926-09-26         BSA:          2.05 m Patient Age:    89 years          BP:           139/100 mmHg Patient Gender: M                 HR:           85 bpm. Exam Location:  Inpatient Procedure: 2D Echo and Intracardiac Opacification Agent Indications:    CHF-Acute Diastolic 428.31 / I50.31  History:        Patient has prior history of Echocardiogram examinations, most                 recent 04/29/2007. CAD, Prior CABG; Risk Factors:Hypertension and                 Dyslipidemia. Alzheimer's dementia, Left femoral fracture.  Sonographer:    Leta Jungling RDCS Referring Phys: [id number] [facility] IMPRESSIONS  1. Left ventricular ejection fraction, by visual estimation, is <20%. The left ventricle has severely decreased function. There is no left ventricular hypertrophy. Indeterminant diastolic  function.  2. Definity contrast agent was given IV to delineate the left ventricular endocardial borders.  3. The left ventricle demonstrates global hypokinesis.  4. Global right ventricle has moderately reduced systolic function.The right ventricular size is normal. No increase in right ventricular wall thickness.  5. Left atrial size was mild-moderately dilated.  6. Right atrial size was mildly dilated.  7. Mild mitral annular calcification.  8. The mitral valve is normal in structure. Mild  mitral valve regurgitation. No evidence of mitral stenosis.  9. The tricuspid valve is normal in structure. Tricuspid valve regurgitation is trivial. 10. The aortic valve is tricuspid. Aortic valve regurgitation is mild. At lease mild aortic valve stenosis, mean gradient only measured at 5 mmHg but suspect this is due to low EF. 11. TR signal is inadequate for assessing pulmonary artery systolic pressure. FINDINGS  Left Ventricle: Left ventricular ejection fraction, by visual estimation, is <20%. The left ventricle has severely decreased function. The left ventricle demonstrates global hypokinesis. Definity contrast agent was given IV to delineate the left ventricular endocardial borders. The left ventricular internal cavity size was the left ventricle is normal in size. There is no left ventricular hypertrophy. Right Ventricle: The right ventricular size is normal. No increase in right ventricular wall thickness. Global RV systolic function is has moderately reduced systolic function. Left Atrium: Left atrial size was mild-moderately dilated. Right Atrium: Right atrial size was mildly dilated Pericardium: There is no evidence of pericardial effusion. Mitral Valve: The mitral valve is normal in structure. There is mild calcification of the mitral valve leaflet(s). Mild mitral annular calcification. Mild mitral valve regurgitation. No evidence of mitral valve stenosis by observation. Tricuspid Valve: The tricuspid valve is normal in structure. Tricuspid valve regurgitation is trivial. Aortic Valve: The aortic valve is tricuspid. Aortic valve regurgitation is mild. Mild aortic stenosis is present. Aortic valve mean gradient measures 4.5 mmHg. Aortic valve peak gradient measures 7.2 mmHg. Aortic valve area, by VTI measures 1.16 cm. Pulmonic Valve: The pulmonic valve was normal in structure. Pulmonic valve regurgitation is not visualized. Aorta: The aortic root is normal in size and structure. Venous: The inferior vena  cava was not well visualized. IAS/Shunts: No atrial level shunt detected by color flow Doppler.  LEFT VENTRICLE PLAX 2D LVIDd:         5.30 cm       Diastology LVIDs:         4.80 cm       LV e' lateral:   6.86 cm/s LV PW:         1.00 cm       LV E/e' lateral: 5.8 LV IVS:        1.10 cm       LV e' medial:    8.00 cm/s LVOT diam:     2.00 cm       LV E/e' medial:  5.0 LV SV:         22.65 ml LV SV Index:   13.56 LVOT Area:     3.14 cm  LV Volumes (MOD) LV area d, A2C:    47.30 cm LV area d, A4C:    36.50 cm LV area s, A2C:    36.50 cm LV area s, A4C:    27.70 cm LV major d, A2C:   8.93 cm LV major d, A4C:   7.45 cm LV major s, A2C:   7.53 cm LV major s, A4C:   6.41 cm LV vol d, MOD A2C:  204.0 ml LV vol d, MOD A4C: 143.0 ml LV vol s, MOD A2C: 142.0 ml LV vol s, MOD A4C: 98.3 ml LV SV MOD A2C:     62.0 ml LV SV MOD A4C:     143.0 ml LV SV MOD BP:      59.0 ml RIGHT VENTRICLE RV S prime:     5.70 cm/s TAPSE (M-mode): 0.9 cm LEFT ATRIUM             Index       RIGHT ATRIUM           Index LA diam:        5.00 cm 2.44 cm/m  RA Area:     21.80 cm LA Vol (A2C):   84.2 ml 41.16 ml/m RA Volume:   65.90 ml  32.21 ml/m LA Vol (A4C):   69.8 ml 34.12 ml/m LA Biplane Vol: 80.1 ml 39.15 ml/m  AORTIC VALVE AV Area (Vmax):    1.10 cm AV Area (Vmean):   0.90 cm AV Area (VTI):     1.16 cm AV Vmax:           134.00 cm/s AV Vmean:          97.950 cm/s AV VTI:            0.194 m AV Peak Grad:      7.2 mmHg AV Mean Grad:      4.5 mmHg LVOT Vmax:         46.80 cm/s LVOT Vmean:        28.200 cm/s LVOT VTI:          0.072 m LVOT/AV VTI ratio: 0.37  AORTA Ao Root diam: 2.60 cm MITRAL VALVE MV Area (PHT): 4.17 cm            SHUNTS MV Decel Time: 182 msec            Systemic VTI:  0.07 m MV E velocity: 39.75 cm/s 103 cm/s Systemic Diam: 2.00 cm Marca Ancona MD Electronically signed by Marca Ancona MD Signature Date/Time: 11/22/2019/2:31:25 PM    Final    DG HIP OPERATIVE UNILAT W OR W/O PELVIS LEFT  Result Date:  11/22/2019 CLINICAL DATA:  86 year old male status post left hip arthroplasty. EXAM: OPERATIVE left HIP (WITH PELVIS IF PERFORMED) 1 VIEWS TECHNIQUE: Fluoroscopic spot image(s) were submitted for interpretation post-operatively. COMPARISON:  Left hip radiograph dated 11/21/2019. FINDINGS: Spot fluoroscopic intraoperative images provided. The total fluoroscopic time is 6 seconds. There is a left hip arthroplasty. No immediate complication. IMPRESSION: Left hip arthroplasty. Electronically Signed   By: Elgie Collard M.D.   On: 11/22/2019 17:00   DG Hip Unilat W or Wo Pelvis 2-3 Views Left  Result Date: 11/21/2019 CLINICAL DATA:  86 year old who fell and complains of LEFT hip and LEFT UPPER leg pain. Initial encounter. EXAM: DG HIP (WITH OR WITHOUT PELVIS) 2-3V LEFT COMPARISON:  None. FINDINGS: Acute subcapital or basicervical LEFT femoral neck fracture. LEFT hip joint anatomically aligned with well-preserved joint space for age. Bone mineral density well preserved for age. Included AP pelvis demonstrates no fractures elsewhere. Well preserved joint space in the contralateral RIGHT hip. Sacroiliac joints anatomically aligned without diastasis, demonstrating only mild degenerative changes. Symphysis pubis anatomically aligned without diastasis or degenerative changes. IMPRESSION: Acute subcapital or basicervical LEFT femoral neck fracture. Electronically Signed   By: Hulan Saas M.D.   On: 11/21/2019 12:28   DG Femur Min 2 Views Left  Result Date: 11/21/2019  CLINICAL DATA:  86 year old who fell and complains of LEFT hip and LEFT UPPER leg pain. Initial encounter. EXAM: LEFT FEMUR 2 VIEWS COMPARISON:  None. FINDINGS: Subcapital/basicervical LEFT femoral neck fracture as detailed on the report of the concurrent LEFT hip images. No fractures elsewhere involving the femur. Well-preserved bone mineral density. Femoropopliteal atherosclerosis. IMPRESSION: Subcapital/basicervical LEFT femoral neck fracture as  detailed on the report of the concurrent LEFT hip images. No fractures elsewhere involving the femur. Electronically Signed   By: Hulan Saas M.D.   On: 11/21/2019 12:32   Assessment and Plan:   1.  Severe systolic cardiomyopathy with known CAD s/p CABG 2001: -Previously followed by Dr. Patty Sermons, last seen 2016 in follow-up.  Last echocardiogram from 2008 on file in epic with mildly reduced LV function.  Given history of cardiovascular disease, echocardiogram performed prior to left hip surgical repair. Unfortunately, LV found to be severely reduced at less than 20% with global hypokinesis. -In the absence of CV symptoms along with advanced age and recent left hip fracture limiting antiplatelet therapies would opt for medical management for systolic dysfunction at this time.  -Currently on ASA 325 mg p.o. daily given recent hip fracture repair -Once okay with orthopedic team, will reduce to ASA 81 mg p.o. daily -We will hold off on beta-blocker therapy at this time secondary to hypotension however if BP can tolerate may benefit from this given tachycardia (may be in the setting of acute pain) -Creatinine mildly elevated today however appears to be close to baseline at 1.37.  Ideally would like to add ACEI/ARB however therapies are limited at this time secondary to low BPs -Appears euvolemic on exam -Would opt to stop IV Lasix to allow for greater BP  2.  Left hip fracture with repair: -Patient initially presented to [facility] after sustaining a mechanical, unwitnessed fall at his home where he lives with his son.  Imaging on ED presentation revealed left femur fracture.  Echocardiogram performed given his history of CAD which revealed a severely reduced LVEF less than 20%.  Given no CV symptoms, patient was taken to the OR for surgical repair without complication. -Management per orthopedics, primary team  3.  Hypertension: -Low, 90/65, 107/73, 101/78 -Not currently on  antihypertensive medications -Would opt to discontinue IV Lasix 20 mg at this time to allow for greater BP and possible addition of ACEI/ARB/beta-blocker  4.  Mild aortic stenosis: -Previous echocardiogram with mild to moderate aortic stenosis however echocardiogram this hospital admission with evidence of mild AS with a mean gradient of 5 mmHg -Unfortunately shows severe LV dysfunction with an LVEF of less than 20% and global hypokinesis -Previous echocardiogram performed in 2008, scanned images in epic -No symptoms at this time  5.  HLD: -LDL, 94 from 08/28/2015 -Not currently on statin therapy  6.  CKD stage II: -Baseline creatinine appears to be in the 1.2-1.4 range -Creatinine today, 1.37 -Could consider low-dose ACE/ARB for guideline directed therapy however would need to be mindful of renal dysfunction   For questions or updates, please contact [facility] Please consult [email or web address] for contact info under Cardiology/STEMI.   SignedGeorgie Chard NP-C [facility] Pager: [phone] 11/24/2019 8:57 AM   History and all data above reviewed.  Patient examined.  I agree with the findings as above.  The history comes from the patient's son who is in the room with him.  The patient is somnolent and confused.  According to his son he has severe dementia that has been progressive.  He has a history of coronary disease as described.  He done relatively well until recently he started having some volume issues.  He had swelling.  Shortness of breath.  He was seen by  [name], Hal, MD given some diuretics and had significant improvement recently with this.  There is been no reported chest discomfort, neck or arm discomfort.  There have been no reported palpitations, presyncope or syncope.  He is not having any PND or orthopnea.  He does not get weight routinely.  He has been no cough fevers or chills.  He gets around in the home without a cane and a walker but has limited mobility.  The  patient exam reveals COR:RRR  ,  Lungs: Decreased breath sounds  ,  Abd: Positive bowel sounds, no rebound no guarding, Ext Mild edema  .  All available labs, radiology testing, previous records reviewed. Agree with documented assessment and plan. ACUTE CHF: The patient has an ejection fraction that he is lower than previously measured and relatively new symptoms consistent with acute systolic heart failure.  He is well compensated at this moment.  He did develop vascular congestion over the last few days but he is oxygenating OK.  He has Cheyne-Stokes respirations.  However, his low blood pressure will not allow med titration.  I agree with gentle diuresis.  I had a long discussion with his son about his condition and we all agreed that he needs really conservative management at this point.  We will follow along for further med suggestions and hopefully will be able to titrate some afterload reduction going forward.   Fayrene FearingJames Kyen Taite  1:56 PM  11/24/2019

## 2019-11-24 NOTE — Progress Notes (Signed)
[facility]                                                                              Patient Demographics  Frank Gates, is a 86 y.o. male, DOB - [birth year], WUJ:[id number]  Admit date - 11/21/2019   Admitting Physician [name], MD  Outpatient Primary MD for the patient is [name], Hal, MD  Outpatient specialists:   LOS - 3  days   Medical records reviewed and are as summarized below:    Chief Complaint  Patient presents with  . Hip Pain       Brief summary   Frank HaHarry R Gaillard Jr. is a 86 y.o. malewith PMH significant for Alzheimer's dementia, HTN, HLD, CADs/p CABG 2001,ischemic heart disease, mild systolic CHF,mild to moderate aortic stenosis. Patient presented secondary to fall, suffering a left hip fracture.   Assessment & Plan    Left hip displaced femoral neck fracture -Secondary to mechanical fall.  Underlying history of dementia.  At baseline ambulatory with a cane. -Orthopedics was consulted, underwent left hip hemiarthroplasty, postop day #2 -Continue PT OT -Appreciate orthopedics reevaluation, left leg internally rotated, complaining of pain -CT left hip rule out fracture, hold PT until after CT scan.  Left hip x-ray negative for any acute change  Active Problems: Alzheimer's dementia -Resides at home with his son and caregivers  Severe systolic cardiomyopathy with known CAD status post CABG 2001, acute systolic CHF, acute respiratory failure with hypoxia - Given history of CVD, 2D echo performed prior to left hip surgical repair showed severely reduced LV function less than 20% with global hypokinesis. - Cardiology consulted, recommended gentle diuresis, conservative management given age, comorbidities, dementia  Hypertension BP currently stable, soft not on any antihypertensives.  Mild aortic stenosis -Currently no acute symptoms  CKD stage II Baseline creatinine 1.2-1.4 range, will follow cardiology condition regarding ACE  /ARB  Code Status: DNR DVT Prophylaxis: SCDs Family Communication: Discussed all imaging results, lab results, explained to the patient's daughter on phone.  Per daughter, family prefers patient home with home health PT OT and caregivers, she is currently arranging them.     Disposition Plan: Patient from home.  May need higher level of care given new left hip fracture and surgery, will await orthopedics clearance prior to DC.  Plan to DC home with home health per family's wishes.   Time Spent in minutes   25 minutes  Procedures:  Left hip hemiarthroplasty  Consultants:   Orthopedic  Antimicrobials:   Anti-infectives (From admission, onward)   Start     Dose/Rate Route Frequency Ordered Stop   11/22/19 2200  ceFAZolin (ANCEF) IVPB 2g/100 mL premix     2 g 200 mL/hr over 30 Minutes Intravenous Every 6 hours 11/22/19 1757 11/23/19 0747   11/22/19 1345  ceFAZolin (ANCEF) 2-4 GM/100ML-% IVPB    Note to Pharmacy: Ramond CraverSpencer, Brittany   : cabinet override      11/22/19 1345 11/23/19 0159          Medications  Scheduled Meds: . aspirin EC  325 mg Oral Q breakfast  . docusate sodium  100 mg Oral BID  .  furosemide  20 mg Intravenous Daily  . senna  1 tablet Oral BID  . sodium chloride flush  3 mL Intravenous Q12H   Continuous Infusions: . lactated ringers 50 mL/hr at 11/22/19 1333   PRN Meds:.acetaminophen, haloperidol lactate, HYDROcodone-acetaminophen, HYDROcodone-acetaminophen, menthol-cetylpyridinium **OR** phenol, metoCLOPramide **OR** metoCLOPramide (REGLAN) injection, morphine injection, morphine injection, ondansetron **OR** ondansetron (ZOFRAN) IV, sodium phosphate, sorbitol      Subjective:   Frank Gates was seen and examined today.  Left leg internally rotated, complaining of pain.  Overnight no acute issues, fevers or chills.  Patient denies dizziness, chest pain, shortness of breath, abdominal pain, N/V/D/C.    Objective:   Vitals:   11/23/19 1600 11/23/19  2046 11/24/19 0533 11/24/19 1423  BP: 101/78 107/73 90/65 107/61  Pulse: 92 (!) 102 91 90  Resp: 20 20 19 16   Temp: 98.8 F (37.1 C) 98.8 F (37.1 C) 98 F (36.7 C) 98.2 F (36.8 C)  TempSrc:  Oral Oral Oral  SpO2:  98% 97% 100%  Weight:      Height:        Intake/Output Summary (Last 24 hours) at 11/24/2019 1448 Last data filed at 11/24/2019 1610 Gross per 24 hour  Intake 120 ml  Output 1350 ml  Net -1230 ml     Wt Readings from Last 3 Encounters:  11/23/19 82.5 kg  08/30/15 82.5 kg  04/26/14 79.4 kg     Exam  General: Alert and oriented, NAD  Cardiovascular: S1 S2 auscultated, no murmurs, RRR  Respiratory: Decreased breath sound at the bases  Gastrointestinal: Soft, nontender, nondistended, + bowel sounds  Ext: Trace pedal edema bilaterally  Neuro: Unable to assess, pain in the left leg  Musculoskeletal: No digital cyanosis, clubbing  Skin: No rashes  Psych: Alert and awake   Data Reviewed:  I have personally reviewed following labs and imaging studies  Micro Results Recent Results (from the past 240 hour(s))  SARS CORONAVIRUS 2 (TAT 6-24 HRS) Nasopharyngeal Nasopharyngeal Swab     Status: None   Collection Time: 11/21/19 11:23 AM   Specimen: Nasopharyngeal Swab  Result Value Ref Range Status   SARS Coronavirus 2 NEGATIVE NEGATIVE Final    Comment: (NOTE) SARS-CoV-2 target nucleic acids are NOT DETECTED. The SARS-CoV-2 RNA is generally detectable in upper and lower respiratory specimens during the acute phase of infection. Negative results do not preclude SARS-CoV-2 infection, do not rule out co-infections with other pathogens, and should not be used as the sole basis for treatment or other patient management decisions. Negative results must be combined with clinical observations, patient history, and epidemiological information. The expected result is Negative. Fact Sheet for Patients: HairSlick.no Fact Sheet for  Healthcare Providers: quierodirigir.com This test is not yet approved or cleared by the [place] FDA and  has been authorized for detection and/or diagnosis of SARS-CoV-2 by FDA under an Emergency Use Authorization (EUA). This EUA will remain  in effect (meaning this test can be used) for the duration of the COVID-19 declaration under Section 56 4(b)(1) of the Act, 21 U.S.C. section 360bbb-3(b)(1), unless the authorization is terminated or revoked sooner. Performed at [facility], [address], [place], [place] [address]   Urine culture     Status: Abnormal   Collection Time: 11/21/19 12:39 PM   Specimen: Urine, Random  Result Value Ref Range Status   Specimen Description   Final    URINE, RANDOM Performed at [facility], [address] [place], [place]  [id number]    Special Requests   Final    NONE Performed at [facility], [address] [place], [place] [address]    Culture >=100,000 COLONIES/mL ESCHERICHIA COLI (A)  Final   Report Status 11/24/2019 FINAL  Final   Organism ID, Bacteria ESCHERICHIA COLI (A)  Final      Susceptibility   Escherichia coli - MIC*    AMPICILLIN 4 SENSITIVE Sensitive     CEFAZOLIN <=4 SENSITIVE Sensitive     CEFTRIAXONE <=0.25 SENSITIVE Sensitive     CIPROFLOXACIN <=0.25 SENSITIVE Sensitive     GENTAMICIN <=1 SENSITIVE Sensitive     IMIPENEM 1 SENSITIVE Sensitive     NITROFURANTOIN <=16 SENSITIVE Sensitive     TRIMETH/SULFA <=20 SENSITIVE Sensitive     AMPICILLIN/SULBACTAM <=2 SENSITIVE Sensitive     * >=100,000 COLONIES/mL ESCHERICHIA COLI  Surgical pcr screen     Status: Abnormal   Collection Time: 11/22/19  5:25 AM   Specimen: Nasal Mucosa; Nasal Swab  Result Value Ref Range Status   MRSA, PCR NEGATIVE NEGATIVE Final   Staphylococcus aureus POSITIVE (A) NEGATIVE Final    Comment: (NOTE) The Xpert SA Assay (FDA approved for NASAL specimens in patients 22 years  of age and older), is one component of a comprehensive surveillance program. It is not intended to diagnose infection nor to guide or monitor treatment. Performed at [facility], [address] [place], [place] [id number]     Radiology Reports DG Knee 1-2 Views Left  Result Date: 11/21/2019 CLINICAL DATA:  [age] year old who fell and complains of LEFT hip and LEFT UPPER leg pain. Initial encounter. EXAM: LEFT KNEE - 1-2 VIEW COMPARISON:  None. FINDINGS: No evidence of acute fracture or dislocation. Moderate MEDIAL and LATERAL compartment joint space narrowing. Patellofemoral joint space well-preserved. Enthesopathic spurring at the insertion of the quadriceps tendon on the SUPERIOR patella and at the insertion of the patellar tendon on the INFERIOR patella. No visible joint effusion. Femoropopliteal atherosclerosis. IMPRESSION: 1. No acute osseous abnormality. 2. Moderate MEDIAL and LATERAL compartment osteoarthritis. Electronically Signed   By: Hulan Saashomas  Lawrence M.D.   On: 11/21/2019 12:30   CT Head Wo Contrast  Result Date: 11/21/2019 CLINICAL DATA:  Unwitnessed fall. EXAM: CT HEAD WITHOUT CONTRAST CT CERVICAL SPINE WITHOUT CONTRAST TECHNIQUE: Multidetector CT imaging of the head and cervical spine was performed following the standard protocol without intravenous contrast. Multiplanar CT image reconstructions of the cervical spine were also generated. COMPARISON:  October 25, 2010. FINDINGS: CT HEAD FINDINGS Brain: Mild diffuse cortical atrophy is noted. Mild chronic ischemic white matter disease is noted. No mass effect or midline shift is noted. Ventricular size is within normal limits. There is no evidence of mass lesion, hemorrhage or acute infarction. Vascular: No hyperdense vessel or unexpected calcification. Skull: Normal. Negative for fracture or focal lesion. Sinuses/Orbits: No acute finding. Other: None. CT CERVICAL SPINE FINDINGS Alignment: Normal. Skull base and vertebrae:  No acute fracture. No primary bone lesion or focal pathologic process. Soft tissues and spinal canal: No prevertebral fluid or swelling. No visible canal hematoma. Disc levels: Mild degenerative disc disease is noted at C4-5, C5-6 and C6-7. Upper chest: Negative. Other: Degenerative changes are seen involving posterior facet joints bilaterally. IMPRESSION: 1. Mild diffuse cortical atrophy. Mild chronic ischemic white matter disease. No acute intracranial abnormality seen. 2. Multilevel degenerative disc disease. No acute abnormality seen in the cervical spine. Electronically Signed   By: Lupita RaiderJames  Green Jr M.D.   On:  11/21/2019 12:36   CT Cervical Spine Wo Contrast  Result Date: 11/21/2019 CLINICAL DATA:  Unwitnessed fall. EXAM: CT HEAD WITHOUT CONTRAST CT CERVICAL SPINE WITHOUT CONTRAST TECHNIQUE: Multidetector CT imaging of the head and cervical spine was performed following the standard protocol without intravenous contrast. Multiplanar CT image reconstructions of the cervical spine were also generated. COMPARISON:  October 25, 2010. FINDINGS: CT HEAD FINDINGS Brain: Mild diffuse cortical atrophy is noted. Mild chronic ischemic white matter disease is noted. No mass effect or midline shift is noted. Ventricular size is within normal limits. There is no evidence of mass lesion, hemorrhage or acute infarction. Vascular: No hyperdense vessel or unexpected calcification. Skull: Normal. Negative for fracture or focal lesion. Sinuses/Orbits: No acute finding. Other: None. CT CERVICAL SPINE FINDINGS Alignment: Normal. Skull base and vertebrae: No acute fracture. No primary bone lesion or focal pathologic process. Soft tissues and spinal canal: No prevertebral fluid or swelling. No visible canal hematoma. Disc levels: Mild degenerative disc disease is noted at C4-5, C5-6 and C6-7. Upper chest: Negative. Other: Degenerative changes are seen involving posterior facet joints bilaterally. IMPRESSION: 1. Mild diffuse cortical  atrophy. Mild chronic ischemic white matter disease. No acute intracranial abnormality seen. 2. Multilevel degenerative disc disease. No acute abnormality seen in the cervical spine. Electronically Signed   By: Lupita Raider M.D.   On: 11/21/2019 12:36   Pelvis Portable  Result Date: 11/22/2019 CLINICAL DATA:  Left hip arthroplasty EXAM: PORTABLE PELVIS 1-2 VIEWS COMPARISON:  11/22/2019 FINDINGS: Single frontal view of the pelvis excludes the iliac crests by collimation. Left hip hemiarthroplasty is identified in expected position without signs of acute complication. Postsurgical changes are seen within the soft tissues. IMPRESSION: 1. Left hip hemiarthroplasty as above. Electronically Signed   By: Sharlet Salina M.D.   On: 11/22/2019 17:15   DG CHEST PORT 1 VIEW  Result Date: 11/23/2019 CLINICAL DATA:  Sudden onset of tachypnea. Hypertension. EXAM: PORTABLE CHEST 1 VIEW COMPARISON:  Radiograph 11/21/2019 FINDINGS: Post median sternotomy and CABG. Stable cardiomegaly and mediastinal contours. Aortic atherosclerosis. Mild vascular congestion without overt edema. Questionable retrocardiac opacity versus overlapping soft tissue structures. No pneumothorax. No acute osseous abnormalities are seen. IMPRESSION: Development of vascular congestion over the past 2 days. Cardiomegaly is stable. Questionable retrocardiac opacity/pleural effusion versus overlapping soft tissue structures. Electronically Signed   By: Narda Rutherford M.D.   On: 11/23/2019 14:13   DG Chest Portable 1 View  Result Date: 11/21/2019 CLINICAL DATA:  Preop. EXAM: PORTABLE CHEST 1 VIEW COMPARISON:  May 29, 2011. FINDINGS: Mild cardiomegaly is noted. No pneumothorax or pleural effusion is noted. Status post coronary bypass graft. Both lungs are clear. The visualized skeletal structures are unremarkable. IMPRESSION: No active disease. Electronically Signed   By: Lupita Raider M.D.   On: 11/21/2019 13:52   DG C-Arm 1-60 Min-No  Report  Result Date: 11/22/2019 Fluoroscopy was utilized by the requesting physician.  No radiographic interpretation.   ECHOCARDIOGRAM COMPLETE  Result Date: 11/22/2019    ECHOCARDIOGRAM REPORT   Patient Name:   Maxamilian Menzel. Date of Exam: 11/22/2019 Medical Rec #:  [id number]         Height:       72.0 in Accession #:    [id number]        Weight:       181.8 lb Date of Birth:  1926/02/03         BSA:          2.05  m Patient Age:    8 years          BP:           139/100 mmHg Patient Gender: M                 HR:           85 bpm. Exam Location:  Inpatient Procedure: 2D Echo and Intracardiac Opacification Agent Indications:    CHF-Acute Diastolic 194.17 / E08.14  History:        Patient has prior history of Echocardiogram examinations, most                 recent 04/29/2007. CAD, Prior CABG; Risk Factors:Hypertension and                 Dyslipidemia. Alzheimer's dementia, Left femoral fracture.  Sonographer:    Darlina Sicilian RDCS Referring Phys: [id number] Dickinson  1. Left ventricular ejection fraction, by visual estimation, is <20%. The left ventricle has severely decreased function. There is no left ventricular hypertrophy. Indeterminant diastolic function.  2. Definity contrast agent was given IV to delineate the left ventricular endocardial borders.  3. The left ventricle demonstrates global hypokinesis.  4. Global right ventricle has moderately reduced systolic function.The right ventricular size is normal. No increase in right ventricular wall thickness.  5. Left atrial size was mild-moderately dilated.  6. Right atrial size was mildly dilated.  7. Mild mitral annular calcification.  8. The mitral valve is normal in structure. Mild mitral valve regurgitation. No evidence of mitral stenosis.  9. The tricuspid valve is normal in structure. Tricuspid valve regurgitation is trivial. 10. The aortic valve is tricuspid. Aortic valve regurgitation is mild. At lease mild aortic valve stenosis, mean  gradient only measured at 5 mmHg but suspect this is due to low EF. 11. TR signal is inadequate for assessing pulmonary artery systolic pressure. FINDINGS  Left Ventricle: Left ventricular ejection fraction, by visual estimation, is <20%. The left ventricle has severely decreased function. The left ventricle demonstrates global hypokinesis. Definity contrast agent was given IV to delineate the left ventricular endocardial borders. The left ventricular internal cavity size was the left ventricle is normal in size. There is no left ventricular hypertrophy. Right Ventricle: The right ventricular size is normal. No increase in right ventricular wall thickness. Global RV systolic function is has moderately reduced systolic function. Left Atrium: Left atrial size was mild-moderately dilated. Right Atrium: Right atrial size was mildly dilated Pericardium: There is no evidence of pericardial effusion. Mitral Valve: The mitral valve is normal in structure. There is mild calcification of the mitral valve leaflet(s). Mild mitral annular calcification. Mild mitral valve regurgitation. No evidence of mitral valve stenosis by observation. Tricuspid Valve: The tricuspid valve is normal in structure. Tricuspid valve regurgitation is trivial. Aortic Valve: The aortic valve is tricuspid. Aortic valve regurgitation is mild. Mild aortic stenosis is present. Aortic valve mean gradient measures 4.5 mmHg. Aortic valve peak gradient measures 7.2 mmHg. Aortic valve area, by VTI measures 1.16 cm. Pulmonic Valve: The pulmonic valve was normal in structure. Pulmonic valve regurgitation is not visualized. Aorta: The aortic root is normal in size and structure. Venous: The inferior vena cava was not well visualized. IAS/Shunts: No atrial level shunt detected by color flow Doppler.  LEFT VENTRICLE PLAX 2D LVIDd:         5.30 cm       Diastology LVIDs:  4.80 cm       LV e' lateral:   6.86 cm/s LV PW:         1.00 cm       LV E/e' lateral:  5.8 LV IVS:        1.10 cm       LV e' medial:    8.00 cm/s LVOT diam:     2.00 cm       LV E/e' medial:  5.0 LV SV:         22.65 ml LV SV Index:   13.56 LVOT Area:     3.14 cm  LV Volumes (MOD) LV area d, A2C:    47.30 cm LV area d, A4C:    36.50 cm LV area s, A2C:    36.50 cm LV area s, A4C:    27.70 cm LV major d, A2C:   8.93 cm LV major d, A4C:   7.45 cm LV major s, A2C:   7.53 cm LV major s, A4C:   6.41 cm LV vol d, MOD A2C: 204.0 ml LV vol d, MOD A4C: 143.0 ml LV vol s, MOD A2C: 142.0 ml LV vol s, MOD A4C: 98.3 ml LV SV MOD A2C:     62.0 ml LV SV MOD A4C:     143.0 ml LV SV MOD BP:      59.0 ml RIGHT VENTRICLE RV S prime:     5.70 cm/s TAPSE (M-mode): 0.9 cm LEFT ATRIUM             Index       RIGHT ATRIUM           Index LA diam:        5.00 cm 2.44 cm/m  RA Area:     21.80 cm LA Vol (A2C):   84.2 ml 41.16 ml/m RA Volume:   65.90 ml  32.21 ml/m LA Vol (A4C):   69.8 ml 34.12 ml/m LA Biplane Vol: 80.1 ml 39.15 ml/m  AORTIC VALVE AV Area (Vmax):    1.10 cm AV Area (Vmean):   0.90 cm AV Area (VTI):     1.16 cm AV Vmax:           134.00 cm/s AV Vmean:          97.950 cm/s AV VTI:            0.194 m AV Peak Grad:      7.2 mmHg AV Mean Grad:      4.5 mmHg LVOT Vmax:         46.80 cm/s LVOT Vmean:        28.200 cm/s LVOT VTI:          0.072 m LVOT/AV VTI ratio: 0.37  AORTA Ao Root diam: 2.60 cm MITRAL VALVE MV Area (PHT): 4.17 cm            SHUNTS MV Decel Time: 182 msec            Systemic VTI:  0.07 m MV E velocity: 39.75 cm/s 103 cm/s Systemic Diam: 2.00 cm Marca Ancona MD Electronically signed by Marca Ancona MD Signature Date/Time: 11/22/2019/2:31:25 PM    Final    DG HIP PORT UNILAT WITH PELVIS 1V LEFT  Result Date: 11/24/2019 CLINICAL DATA:  Postop hip arthroplasty. EXAM: DG HIP (WITH OR WITHOUT PELVIS) 1V PORT LEFT COMPARISON:  11/22/2019. FINDINGS: Left hip arthroplasty. Femoral stem appears well seated. Surgical skin staples are in place. Subcutaneous air has  decreased somewhat in the  interval. Remainder of the visualized pelvis is unremarkable. IMPRESSION: Left hip arthroplasty with expected postoperative findings. Electronically Signed   By: Leanna Battles M.D.   On: 11/24/2019 11:10   DG HIP OPERATIVE UNILAT W OR W/O PELVIS LEFT  Result Date: 11/22/2019 CLINICAL DATA:  86 year old male status post left hip arthroplasty. EXAM: OPERATIVE left HIP (WITH PELVIS IF PERFORMED) 1 VIEWS TECHNIQUE: Fluoroscopic spot image(s) were submitted for interpretation post-operatively. COMPARISON:  Left hip radiograph dated 11/21/2019. FINDINGS: Spot fluoroscopic intraoperative images provided. The total fluoroscopic time is 6 seconds. There is a left hip arthroplasty. No immediate complication. IMPRESSION: Left hip arthroplasty. Electronically Signed   By: Elgie Collard M.D.   On: 11/22/2019 17:00   DG Hip Unilat W or Wo Pelvis 2-3 Views Left  Result Date: 11/21/2019 CLINICAL DATA:  86 year old who fell and complains of LEFT hip and LEFT UPPER leg pain. Initial encounter. EXAM: DG HIP (WITH OR WITHOUT PELVIS) 2-3V LEFT COMPARISON:  None. FINDINGS: Acute subcapital or basicervical LEFT femoral neck fracture. LEFT hip joint anatomically aligned with well-preserved joint space for age. Bone mineral density well preserved for age. Included AP pelvis demonstrates no fractures elsewhere. Well preserved joint space in the contralateral RIGHT hip. Sacroiliac joints anatomically aligned without diastasis, demonstrating only mild degenerative changes. Symphysis pubis anatomically aligned without diastasis or degenerative changes. IMPRESSION: Acute subcapital or basicervical LEFT femoral neck fracture. Electronically Signed   By: Hulan Saas M.D.   On: 11/21/2019 12:28   DG Femur Min 2 Views Left  Result Date: 11/21/2019 CLINICAL DATA:  86 year old who fell and complains of LEFT hip and LEFT UPPER leg pain. Initial encounter. EXAM: LEFT FEMUR 2 VIEWS COMPARISON:  None. FINDINGS: Subcapital/basicervical  LEFT femoral neck fracture as detailed on the report of the concurrent LEFT hip images. No fractures elsewhere involving the femur. Well-preserved bone mineral density. Femoropopliteal atherosclerosis. IMPRESSION: Subcapital/basicervical LEFT femoral neck fracture as detailed on the report of the concurrent LEFT hip images. No fractures elsewhere involving the femur. Electronically Signed   By: Hulan Saas M.D.   On: 11/21/2019 12:32    Lab Data:  CBC: Recent Labs  Lab 11/21/19 1123 11/22/19 0536 11/23/19 0634 11/24/19 0445  WBC 11.1* 9.6 8.4 10.3  NEUTROABS 9.6*  --   --   --   HGB 15.3 13.9 13.6 12.1*  HCT 47.4 43.8 43.4 37.3*  MCV 94.0 95.0 96.9 96.1  PLT 190 157 141* 140*   Basic Metabolic Panel: Recent Labs  Lab 11/21/19 1123 11/22/19 0536 11/23/19 0634 11/24/19 0445  NA 137 141 142 144  K 3.8 3.8 4.3 3.6  CL 105 110 113* 114*  CO2 26 23 25 25   GLUCOSE 144* 123* 115* 124*  BUN 37* 34* 32* 34*  CREATININE 1.50* 1.41* 1.41* 1.37*  CALCIUM 9.3 9.4 9.0 9.1   GFR: Estimated Creatinine Clearance: 38.5 mL/min (A) (by C-G formula based on SCr of 1.37 mg/dL (H)). Liver Function Tests: No results for input(s): AST, ALT, ALKPHOS, BILITOT, PROT, ALBUMIN in the last 168 hours. No results for input(s): LIPASE, AMYLASE in the last 168 hours. No results for input(s): AMMONIA in the last 168 hours. Coagulation Profile: No results for input(s): INR, PROTIME in the last 168 hours. Cardiac Enzymes: Recent Labs  Lab 11/21/19 1123  CKTOTAL 49   BNP (last 3 results) No results for input(s): PROBNP in the last 8760 hours. HbA1C: No results for input(s): HGBA1C in the last 72 hours. CBG: No  results for input(s): GLUCAP in the last 168 hours. Lipid Profile: No results for input(s): CHOL, HDL, LDLCALC, TRIG, CHOLHDL, LDLDIRECT in the last 72 hours. Thyroid Function Tests: No results for input(s): TSH, T4TOTAL, FREET4, T3FREE, THYROIDAB in the last 72 hours. Anemia Panel: No  results for input(s): VITAMINB12, FOLATE, FERRITIN, TIBC, IRON, RETICCTPCT in the last 72 hours. Urine analysis:    Component Value Date/Time   COLORURINE AMBER (A) 11/21/2019 1115   APPEARANCEUR CLEAR 11/21/2019 1115   LABSPEC 1.020 11/21/2019 1115   PHURINE 5.0 11/21/2019 1115   GLUCOSEU NEGATIVE 11/21/2019 1115   HGBUR SMALL (A) 11/21/2019 1115   BILIRUBINUR NEGATIVE 11/21/2019 1115   KETONESUR NEGATIVE 11/21/2019 1115   PROTEINUR NEGATIVE 11/21/2019 1115   NITRITE NEGATIVE 11/21/2019 1115   LEUKOCYTESUR TRACE (A) 11/21/2019 1115     Kaileia Flow M.D. [facility] 11/24/2019, 2:48 PM   Call night coverage person covering after 7pm

## 2019-11-25 DIAGNOSIS — S72001A Fracture of unspecified part of neck of right femur, initial encounter for closed fracture: Secondary | ICD-10-CM

## 2019-11-25 DIAGNOSIS — Z96642 Presence of left artificial hip joint: Secondary | ICD-10-CM

## 2019-11-25 DIAGNOSIS — Z515 Encounter for palliative care: Secondary | ICD-10-CM

## 2019-11-25 DIAGNOSIS — Z7189 Other specified counseling: Secondary | ICD-10-CM

## 2019-11-25 DIAGNOSIS — W19XXXD Unspecified fall, subsequent encounter: Secondary | ICD-10-CM

## 2019-11-25 LAB — CBC
HCT: 37.8 % — ABNORMAL LOW (ref 39.0–52.0)
Hemoglobin: 12.2 g/dL — ABNORMAL LOW (ref 13.0–17.0)
MCH: 31.2 pg (ref 26.0–34.0)
MCHC: 32.3 g/dL (ref 30.0–36.0)
MCV: 96.7 fL (ref 80.0–100.0)
Platelets: 157 10*3/uL (ref 150–400)
RBC: 3.91 MIL/uL — ABNORMAL LOW (ref 4.22–5.81)
RDW: 12.9 % (ref 11.5–15.5)
WBC: 9.5 10*3/uL (ref 4.0–10.5)
nRBC: 0 % (ref 0.0–0.2)

## 2019-11-25 LAB — BASIC METABOLIC PANEL
Anion gap: 9 (ref 5–15)
BUN: 39 mg/dL — ABNORMAL HIGH (ref 8–23)
CO2: 26 mmol/L (ref 22–32)
Calcium: 9.2 mg/dL (ref 8.9–10.3)
Chloride: 110 mmol/L (ref 98–111)
Creatinine, Ser: 1.23 mg/dL (ref 0.61–1.24)
GFR calc Af Amer: 59 mL/min — ABNORMAL LOW (ref >60)
GFR calc non Af Amer: 51 mL/min — ABNORMAL LOW (ref >60)
Glucose, Bld: 135 mg/dL — ABNORMAL HIGH (ref 70–99)
Potassium: 3.8 mmol/L (ref 3.5–5.1)
Sodium: 145 mmol/L (ref 135–145)

## 2019-11-25 MED ORDER — MORPHINE SULFATE (PF) 2 MG/ML IV SOLN
0.5000 mg | INTRAVENOUS | Status: DC | PRN
  Administered 2019-11-25: 0.5 mg via INTRAVENOUS
  Filled 2019-11-25: qty 1

## 2019-11-25 MED ORDER — FUROSEMIDE 20 MG PO TABS
20.0000 mg | ORAL_TABLET | Freq: Every day | ORAL | Status: DC
  Administered 2019-11-25 – 2019-11-26 (×2): 20 mg via ORAL
  Filled 2019-11-25 (×2): qty 1

## 2019-11-25 NOTE — Care Management Important Message (Signed)
Important Message  Patient Details IM Letter given to Daryel Gerald SW Case Manager to present to the Patient Name: Frank Gates. MRN: [id number] Date of Birth: 1925/12/17   Medicare Important Message Given:  Yes     Caren Macadam 11/25/2019, 10:02 AM

## 2019-11-25 NOTE — Consult Note (Signed)
Palliative care consult note  Reason for consult: Goals of care in light of Alzheimer's dementia with new hip fracture  Palliative care consult received.  Chart reviewed including personal review of pertinent labs and imaging.  Discussed with Dr. Tana Coast and bedside RN.  Briefly, Frank Gates is a 86 year old male with past medical history of Alzheimer's dementia, hypertension, hyperlipidemia, CAD status post CABG in 2001, ischemic heart disease, congestive heart failure, mild to moderate aortic stenosis who presented after unwitnessed fall with hip fracture now status post left hip hemiarthroplasty.  It was discovered this admission that EF is less than 20%.  Palliative consulted for goals of care.  I saw and examined Frank Gates.  He is very sleepy this morning and is unable to participate in conversation.  In either case, at baseline, he is confused and unable to express desires for goals of care.  I called and was able to reach his daughter, Frank Gates.  I was also able to speak with his son, Frank Gates, at the bedside following my telephone conversation with Frank Gates.  I introduced palliative care as specialized medical care for people living with serious illness. It focuses on providing relief from the symptoms and stress of a serious illness. The goal is to improve quality of life for both the patient and the family.  Frank Gates reports that Frank Gates is an independent man and being out of the hospital at home has been one of the most important things to him.  His cognitive and functional status has declined with progression of his dementia and he spends most of the day trying to keep busy by doing things such as putting away dishes and making his bed.  He enjoys being at home, eating, and being outside.  She reports he had fairly good functional status prior to fall and was even able to help some with raking leaves earlier this year.  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  We discussed his clinical course with changes in his nutrition, cognition, and functional status as well as wishes moving forward in regard to advanced directives and care plan this hospitalization.  Concepts specific to code status and rehospitalization discussed.  We discussed difference between a aggressive medical intervention path and a palliative, comfort focused care path.  Values and goals of care important to patient and family were attempted to be elicited.  Frank Gates reports being very realistic and understanding that the incidence of death in the next 6 months with his dementia, severity of his heart failure, and new hip fracture is extremely high.  Both she and her brother want to focus on allowing Frank Gates to regain as much function as possible and enjoy whatever time he has left.  We discussed that the hospital can be useful as long as he is getting well enough from care he receives at the hospital to enjoy his time at home, but there is going to come a time in the near future where, if his goal is to be at home, he may be better served to plan on being at home and bringing care to him at home rather repeated trips to the hospital. We discussed hospice as a tool that may be beneficial in this goal when he reaches a point where we are trying to fix problems that are not fixable.  She is in agreement that a good plan would be to plan to take him  home with home health as they have previously been arranging. He has done well with rehabbing in the past and she feels it is very important to have PT and OT input when he discharges home. If he does well at home and continues to thrive, I encouraged they continue with this plan. If, however he is unable to regain function and he continues to decline, I recommended that she speak with his PCP to determine if he may be better served by focusing his care on staying at home with support of organization such as hospice.  We also discussed  home-based palliative care and how they can help in both supporting her father and helping with decisions based upon his clinical course of the next couple of weeks.  She is certainly in agreement that this would be a good move and we discussed organizations per her request.  - DNR/DNI -Goal is home with home health to see how much functional status he can recover with home-based PT and OT.  Family would also like for him to be followed by outpatient palliative care.  Depending upon his clinical course, consideration for election of his hospice benefits if he continues to decline in his nutrition, cognition, and functional status. -I left several pieces of information for his daughter at the bedside.  -Copy of [employer] Choices for Loving People  -Copy of a MOST form to consider completion  -Website information for [facility] palliative care and [facility] ([facility]).  She will review these and let us know which organization she would like to work with on discharge.  -My card and contact information  I also reviewed all of the above information with his Mr.'s Gates's son (Frank Gates) at the bedside  Start time: 1015 End time: 1145 Total time: 90 minutes Greater than 50%  of this time was spent counseling and coordinating care related to the above assessment and plan.  Questions and concerns addressed.   PMT will continue to support holistically.  Romie Minus, MD [facility] Palliative Medicine Team [phone]

## 2019-11-25 NOTE — Progress Notes (Signed)
[facility]                                                                              Patient Demographics  Frank Gates, is a 86 y.o. male, DOB - [birth year], XYI:[id number]  Admit date - 11/21/2019   Admitting Physician Lorin Glass, MD  Outpatient Primary MD for the patient is Merlene Laughter, MD  Outpatient specialists:   LOS - 4  days   Medical records reviewed and are as summarized below:    Chief Complaint  Patient presents with  . Hip Pain       Brief summary   Frank Caez. is a 86 y.o. malewith PMH significant for Alzheimer's dementia, HTN, HLD, CADs/p CABG 2001,ischemic heart disease, mild systolic CHF,mild to moderate aortic stenosis. Patient presented secondary to fall, suffering a left hip fracture.   Assessment & Plan    Left hip displaced femoral neck fracture -Secondary to mechanical fall.  Underlying history of dementia.  At baseline ambulatory with a cane. -Orthopedics was consulted, underwent left hip hemiarthroplasty, postop day #3 -Continue PT OT -CT left hip with no acute changes, continue physical therapy -Somewhat lethargic today, DC oral narcotics, decrease morphine   Active Problems: Alzheimer's dementia -Resides at home with his son and caregivers  Severe systolic cardiomyopathy with known CAD status post CABG 2001, acute systolic CHF, acute respiratory failure with hypoxia - Given history of CVD, 2D echo performed prior to left hip surgical repair showed severely reduced LV function less than 20% with global hypokinesis. - Cardiology consulted, recommended gentle diuresis, conservative management given age, comorbidities, dementia -Cardiology following, continue IV Lasix  Hypertension BP soft  Mild aortic stenosis -Currently no acute symptoms  CKD stage II Baseline creatinine 1.2-1.4 range,  -Creatinine improving  Code Status: DNR DVT Prophylaxis: SCDs Family Communication: Discussed all imaging results,  lab results, explained to the patient's daughter on phone.   Disposition Plan: Patient from home.  Plan to DC home with home health per family's wishes tomorrow. palliative GOC done today   Time Spent in minutes   25 minutes  Procedures:  Left hip hemiarthroplasty  Consultants:   Orthopedic  Antimicrobials:   Anti-infectives (From admission, onward)   Start     Dose/Rate Route Frequency Ordered Stop   11/22/19 2200  ceFAZolin (ANCEF) IVPB 2g/100 mL premix     2 g 200 mL/hr over 30 Minutes Intravenous Every 6 hours 11/22/19 1757 11/23/19 0747   11/22/19 1345  ceFAZolin (ANCEF) 2-4 GM/100ML-% IVPB    Note to Pharmacy: Ramond Craver   : cabinet override      11/22/19 1345 11/23/19 0159         Medications  Scheduled Meds: . aspirin EC  325 mg Oral Q breakfast  . docusate sodium  100 mg Oral BID  . furosemide  20 mg Oral Daily  . senna  1 tablet Oral BID  . sodium chloride flush  3 mL Intravenous Q12H   Continuous Infusions: . lactated ringers 50 mL/hr at 11/22/19 1333   PRN Meds:.acetaminophen, haloperidol lactate, HYDROcodone-acetaminophen, HYDROcodone-acetaminophen, menthol-cetylpyridinium **OR** phenol, metoCLOPramide **OR** metoCLOPramide (REGLAN) injection, morphine injection,  morphine injection, ondansetron **OR** ondansetron (ZOFRAN) IV, sodium phosphate, sorbitol      Subjective:   Frank Gates was seen and examined today.  Patient somewhat sleepy and lethargic today, no acute issues overnight.  No fevers or chills.  Difficult to obtain review of system from the patient    Objective:   Vitals:   11/24/19 1423 11/24/19 2037 11/25/19 0443 11/25/19 1343  BP: 107/61 105/68 106/60 104/80  Pulse: 90 (!) 101 83 (!) 102  Resp: 16 18 18 14   Temp: 98.2 F (36.8 C) 98.6 F (37 C) 98.9 F (37.2 C) 98 F (36.7 C)  TempSrc: Oral Oral Oral Oral  SpO2: 100% 100% 99% 100%  Weight:      Height:        Intake/Output Summary (Last 24 hours) at 11/25/2019  1504 Last data filed at 11/25/2019 1345 Gross per 24 hour  Intake --  Output 1300 ml  Net -1300 ml     Wt Readings from Last 3 Encounters:  11/23/19 82.5 kg  08/30/15 82.5 kg  04/26/14 79.4 kg    Physical Exam  General: Somewhat lethargic but arousable, NAD  Cardiovascular: S1 S2 clear, no murmurs, RRR. No pedal edema b/l  Respiratory: CTAB, no wheezing, rales or rhonchi  Gastrointestinal: Soft, nontender, nondistended, NBS  Ext: no pedal edema bilaterally  Neuro: Not following commands  Musculoskeletal: No cyanosis, clubbing  Skin: No rashes  Psych: Somewhat lethargic   Data Reviewed:  I have personally reviewed following labs and imaging studies  Micro Results Recent Results (from the past 240 hour(s))  SARS CORONAVIRUS 2 (TAT 6-24 HRS) Nasopharyngeal Nasopharyngeal Swab     Status: None   Collection Time: 11/21/19 11:23 AM   Specimen: Nasopharyngeal Swab  Result Value Ref Range Status   SARS Coronavirus 2 NEGATIVE NEGATIVE Final    Comment: (NOTE) SARS-CoV-2 target nucleic acids are NOT DETECTED. The SARS-CoV-2 RNA is generally detectable in upper and lower respiratory specimens during the acute phase of infection. Negative results do not preclude SARS-CoV-2 infection, do not rule out co-infections with other pathogens, and should not be used as the sole basis for treatment or other patient management decisions. Negative results must be combined with clinical observations, patient history, and epidemiological information. The expected result is Negative. Fact Sheet for Patients: HairSlick.nohttps://www.fda.gov/media/138098/download Fact Sheet for Healthcare Providers: quierodirigir.comhttps://www.fda.gov/media/138095/download This test is not yet approved or cleared by the [place] FDA and  has been authorized for detection and/or diagnosis of SARS-CoV-2 by FDA under an Emergency Use Authorization (EUA). This EUA will remain  in effect (meaning this test can be used) for the  duration of the COVID-19 declaration under Section 56 4(b)(1) of the Act, 21 U.S.C. section 360bbb-3(b)(1), unless the authorization is terminated or revoked sooner. Performed at [facility], [address] [place], [place] [address]   Urine culture     Status: Abnormal   Collection Time: 11/21/19 12:39 PM   Specimen: Urine, Random  Result Value Ref Range Status   Specimen Description   Final    URINE, RANDOM Performed at [facility], [address] [place], [place] [address]    Special Requests   Final    NONE Performed at [facility], [address] [place], [place] [address]    Culture >=100,000 COLONIES/mL ESCHERICHIA COLI (A)  Final   Report Status 11/24/2019 FINAL  Final   Organism ID, Bacteria ESCHERICHIA COLI (A)  Final  Susceptibility   Escherichia coli - MIC*    AMPICILLIN 4 SENSITIVE Sensitive     CEFAZOLIN <=4 SENSITIVE Sensitive     CEFTRIAXONE <=0.25 SENSITIVE Sensitive     CIPROFLOXACIN <=0.25 SENSITIVE Sensitive     GENTAMICIN <=1 SENSITIVE Sensitive     IMIPENEM 1 SENSITIVE Sensitive     NITROFURANTOIN <=16 SENSITIVE Sensitive     TRIMETH/SULFA <=20 SENSITIVE Sensitive     AMPICILLIN/SULBACTAM <=2 SENSITIVE Sensitive     * >=100,000 COLONIES/mL ESCHERICHIA COLI  Surgical pcr screen     Status: Abnormal   Collection Time: 11/22/19  5:25 AM   Specimen: Nasal Mucosa; Nasal Swab  Result Value Ref Range Status   MRSA, PCR NEGATIVE NEGATIVE Final   Staphylococcus aureus POSITIVE (A) NEGATIVE Final    Comment: (NOTE) The Xpert SA Assay (FDA approved for NASAL specimens in patients 84 years of age and older), is one component of a comprehensive surveillance program. It is not intended to diagnose infection nor to guide or monitor treatment. Performed at [facility], [address] [place], [place] [address]     Radiology Reports DG Knee 1-2 Views Left  Result Date:  11/21/2019 CLINICAL DATA:  86 year old who fell and complains of LEFT hip and LEFT UPPER leg pain. Initial encounter. EXAM: LEFT KNEE - 1-2 VIEW COMPARISON:  None. FINDINGS: No evidence of acute fracture or dislocation. Moderate MEDIAL and LATERAL compartment joint space narrowing. Patellofemoral joint space well-preserved. Enthesopathic spurring at the insertion of the quadriceps tendon on the SUPERIOR patella and at the insertion of the patellar tendon on the INFERIOR patella. No visible joint effusion. Femoropopliteal atherosclerosis. IMPRESSION: 1. No acute osseous abnormality. 2. Moderate MEDIAL and LATERAL compartment osteoarthritis. Electronically Signed   By: Hulan Saashomas  Lawrence M.D.   On: 11/21/2019 12:30   CT Head Wo Contrast  Result Date: 11/21/2019 CLINICAL DATA:  Unwitnessed fall. EXAM: CT HEAD WITHOUT CONTRAST CT CERVICAL SPINE WITHOUT CONTRAST TECHNIQUE: Multidetector CT imaging of the head and cervical spine was performed following the standard protocol without intravenous contrast. Multiplanar CT image reconstructions of the cervical spine were also generated. COMPARISON:  October 25, 2010. FINDINGS: CT HEAD FINDINGS Brain: Mild diffuse cortical atrophy is noted. Mild chronic ischemic white matter disease is noted. No mass effect or midline shift is noted. Ventricular size is within normal limits. There is no evidence of mass lesion, hemorrhage or acute infarction. Vascular: No hyperdense vessel or unexpected calcification. Skull: Normal. Negative for fracture or focal lesion. Sinuses/Orbits: No acute finding. Other: None. CT CERVICAL SPINE FINDINGS Alignment: Normal. Skull base and vertebrae: No acute fracture. No primary bone lesion or focal pathologic process. Soft tissues and spinal canal: No prevertebral fluid or swelling. No visible canal hematoma. Disc levels: Mild degenerative disc disease is noted at C4-5, C5-6 and C6-7. Upper chest: Negative. Other: Degenerative changes are seen involving  posterior facet joints bilaterally. IMPRESSION: 1. Mild diffuse cortical atrophy. Mild chronic ischemic white matter disease. No acute intracranial abnormality seen. 2. Multilevel degenerative disc disease. No acute abnormality seen in the cervical spine. Electronically Signed   By: Lupita RaiderJames  Green Jr M.D.   On: 11/21/2019 12:36   CT Cervical Spine Wo Contrast  Result Date: 11/21/2019 CLINICAL DATA:  Unwitnessed fall. EXAM: CT HEAD WITHOUT CONTRAST CT CERVICAL SPINE WITHOUT CONTRAST TECHNIQUE: Multidetector CT imaging of the head and cervical spine was performed following the standard protocol without intravenous contrast. Multiplanar CT image reconstructions of the cervical spine were also generated. COMPARISON:  October 25, 2010. FINDINGS: CT HEAD FINDINGS Brain: Mild diffuse cortical atrophy is noted. Mild chronic ischemic white matter disease is noted. No mass effect or midline shift is noted. Ventricular size is within normal limits. There is no evidence of mass lesion, hemorrhage or acute infarction. Vascular: No hyperdense vessel or unexpected calcification. Skull: Normal. Negative for fracture or focal lesion. Sinuses/Orbits: No acute finding. Other: None. CT CERVICAL SPINE FINDINGS Alignment: Normal. Skull base and vertebrae: No acute fracture. No primary bone lesion or focal pathologic process. Soft tissues and spinal canal: No prevertebral fluid or swelling. No visible canal hematoma. Disc levels: Mild degenerative disc disease is noted at C4-5, C5-6 and C6-7. Upper chest: Negative. Other: Degenerative changes are seen involving posterior facet joints bilaterally. IMPRESSION: 1. Mild diffuse cortical atrophy. Mild chronic ischemic white matter disease. No acute intracranial abnormality seen. 2. Multilevel degenerative disc disease. No acute abnormality seen in the cervical spine. Electronically Signed   By: Lupita Raider M.D.   On: 11/21/2019 12:36   Pelvis Portable  Result Date: 11/22/2019 CLINICAL  DATA:  Left hip arthroplasty EXAM: PORTABLE PELVIS 1-2 VIEWS COMPARISON:  11/22/2019 FINDINGS: Single frontal view of the pelvis excludes the iliac crests by collimation. Left hip hemiarthroplasty is identified in expected position without signs of acute complication. Postsurgical changes are seen within the soft tissues. IMPRESSION: 1. Left hip hemiarthroplasty as above. Electronically Signed   By: Sharlet Salina M.D.   On: 11/22/2019 17:15   CT HIP LEFT WO CONTRAST  Result Date: 11/24/2019 CLINICAL DATA:  Left hip fracture EXAM: CT OF THE LEFT HIP WITHOUT CONTRAST TECHNIQUE: Multidetector CT imaging of the left hip was performed according to the standard protocol. Multiplanar CT image reconstructions were also generated. COMPARISON:  Multiple priors most recent same day radiograph FINDINGS: Bones/Joint/Cartilage The patient is status post left total hip arthroplasty. No periprosthetic lucency or acute fracture is identified. The femoral head prosthesis is well seated within the acetabular component. There is slight anterior rotation of the femoral shaft. A moderate hip joint effusion is seen. Ligaments Suboptimally assessed by CT. Muscles and Tendons There is diffuse subcutaneous emphysema seen surrounding the hip extending into the adductor, anterior, gluteal musculature. There is limited visualization of the tendons, however no displaced tendon rupture seen. Soft tissues Soft tissue swelling with subcutaneous emphysema seen overlying the lateral hip. The sacroiliac joint is intact. There is diffuse osteopenia. The visualized deep pelvis is unremarkable. Scattered dense vascular calcifications are noted. IMPRESSION: Status post left total hip arthroplasty without complication. Electronically Signed   By: Jonna Clark M.D.   On: 11/24/2019 19:09   DG CHEST PORT 1 VIEW  Result Date: 11/23/2019 CLINICAL DATA:  Sudden onset of tachypnea. Hypertension. EXAM: PORTABLE CHEST 1 VIEW COMPARISON:  Radiograph  11/21/2019 FINDINGS: Post median sternotomy and CABG. Stable cardiomegaly and mediastinal contours. Aortic atherosclerosis. Mild vascular congestion without overt edema. Questionable retrocardiac opacity versus overlapping soft tissue structures. No pneumothorax. No acute osseous abnormalities are seen. IMPRESSION: Development of vascular congestion over the past 2 days. Cardiomegaly is stable. Questionable retrocardiac opacity/pleural effusion versus overlapping soft tissue structures. Electronically Signed   By: Narda Rutherford M.D.   On: 11/23/2019 14:13   DG Chest Portable 1 View  Result Date: 11/21/2019 CLINICAL DATA:  Preop. EXAM: PORTABLE CHEST 1 VIEW COMPARISON:  May 29, 2011. FINDINGS: Mild cardiomegaly is noted. No pneumothorax or pleural effusion is noted. Status post coronary bypass graft. Both lungs are clear. The visualized skeletal structures are unremarkable.  IMPRESSION: No active disease. Electronically Signed   By: Lupita Raider M.D.   On: 11/21/2019 13:52   DG C-Arm 1-60 Min-No Report  Result Date: 11/22/2019 Fluoroscopy was utilized by the requesting physician.  No radiographic interpretation.   ECHOCARDIOGRAM COMPLETE  Result Date: 11/22/2019    ECHOCARDIOGRAM REPORT   Patient Name:   Frank Gates. Date of Exam: 11/22/2019 Medical Rec #:  [id number]         Height:       72.0 in Accession #:    [id number]        Weight:       181.8 lb Date of Birth:  September 10, 1926         BSA:          2.05 m Patient Age:    89 years          BP:           139/100 mmHg Patient Gender: M                 HR:           85 bpm. Exam Location:  Inpatient Procedure: 2D Echo and Intracardiac Opacification Agent Indications:    CHF-Acute Diastolic 428.31 / I50.31  History:        Patient has prior history of Echocardiogram examinations, most                 recent 04/29/2007. CAD, Prior CABG; Risk Factors:Hypertension and                 Dyslipidemia. Alzheimer's dementia, Left femoral fracture.   Sonographer:    Leta Jungling RDCS Referring Phys: [id number] [facility] IMPRESSIONS  1. Left ventricular ejection fraction, by visual estimation, is <20%. The left ventricle has severely decreased function. There is no left ventricular hypertrophy. Indeterminant diastolic function.  2. Definity contrast agent was given IV to delineate the left ventricular endocardial borders.  3. The left ventricle demonstrates global hypokinesis.  4. Global right ventricle has moderately reduced systolic function.The right ventricular size is normal. No increase in right ventricular wall thickness.  5. Left atrial size was mild-moderately dilated.  6. Right atrial size was mildly dilated.  7. Mild mitral annular calcification.  8. The mitral valve is normal in structure. Mild mitral valve regurgitation. No evidence of mitral stenosis.  9. The tricuspid valve is normal in structure. Tricuspid valve regurgitation is trivial. 10. The aortic valve is tricuspid. Aortic valve regurgitation is mild. At lease mild aortic valve stenosis, mean gradient only measured at 5 mmHg but suspect this is due to low EF. 11. TR signal is inadequate for assessing pulmonary artery systolic pressure. FINDINGS  Left Ventricle: Left ventricular ejection fraction, by visual estimation, is <20%. The left ventricle has severely decreased function. The left ventricle demonstrates global hypokinesis. Definity contrast agent was given IV to delineate the left ventricular endocardial borders. The left ventricular internal cavity size was the left ventricle is normal in size. There is no left ventricular hypertrophy. Right Ventricle: The right ventricular size is normal. No increase in right ventricular wall thickness. Global RV systolic function is has moderately reduced systolic function. Left Atrium: Left atrial size was mild-moderately dilated. Right Atrium: Right atrial size was mildly dilated Pericardium: There is no evidence of pericardial effusion. Mitral  Valve: The mitral valve is normal in structure. There is mild calcification of the mitral valve leaflet(s). Mild mitral annular calcification. Mild mitral valve regurgitation.  No evidence of mitral valve stenosis by observation. Tricuspid Valve: The tricuspid valve is normal in structure. Tricuspid valve regurgitation is trivial. Aortic Valve: The aortic valve is tricuspid. Aortic valve regurgitation is mild. Mild aortic stenosis is present. Aortic valve mean gradient measures 4.5 mmHg. Aortic valve peak gradient measures 7.2 mmHg. Aortic valve area, by VTI measures 1.16 cm. Pulmonic Valve: The pulmonic valve was normal in structure. Pulmonic valve regurgitation is not visualized. Aorta: The aortic root is normal in size and structure. Venous: The inferior vena cava was not well visualized. IAS/Shunts: No atrial level shunt detected by color flow Doppler.  LEFT VENTRICLE PLAX 2D LVIDd:         5.30 cm       Diastology LVIDs:         4.80 cm       LV e' lateral:   6.86 cm/s LV PW:         1.00 cm       LV E/e' lateral: 5.8 LV IVS:        1.10 cm       LV e' medial:    8.00 cm/s LVOT diam:     2.00 cm       LV E/e' medial:  5.0 LV SV:         22.65 ml LV SV Index:   13.56 LVOT Area:     3.14 cm  LV Volumes (MOD) LV area d, A2C:    47.30 cm LV area d, A4C:    36.50 cm LV area s, A2C:    36.50 cm LV area s, A4C:    27.70 cm LV major d, A2C:   8.93 cm LV major d, A4C:   7.45 cm LV major s, A2C:   7.53 cm LV major s, A4C:   6.41 cm LV vol d, MOD A2C: 204.0 ml LV vol d, MOD A4C: 143.0 ml LV vol s, MOD A2C: 142.0 ml LV vol s, MOD A4C: 98.3 ml LV SV MOD A2C:     62.0 ml LV SV MOD A4C:     143.0 ml LV SV MOD BP:      59.0 ml RIGHT VENTRICLE RV S prime:     5.70 cm/s TAPSE (M-mode): 0.9 cm LEFT ATRIUM             Index       RIGHT ATRIUM           Index LA diam:        5.00 cm 2.44 cm/m  RA Area:     21.80 cm LA Vol (A2C):   84.2 ml 41.16 ml/m RA Volume:   65.90 ml  32.21 ml/m LA Vol (A4C):   69.8 ml 34.12 ml/m LA  Biplane Vol: 80.1 ml 39.15 ml/m  AORTIC VALVE AV Area (Vmax):    1.10 cm AV Area (Vmean):   0.90 cm AV Area (VTI):     1.16 cm AV Vmax:           134.00 cm/s AV Vmean:          97.950 cm/s AV VTI:            0.194 m AV Peak Grad:      7.2 mmHg AV Mean Grad:      4.5 mmHg LVOT Vmax:         46.80 cm/s LVOT Vmean:        28.200 cm/s LVOT VTI:  0.072 m LVOT/AV VTI ratio: 0.37  AORTA Ao Root diam: 2.60 cm MITRAL VALVE MV Area (PHT): 4.17 cm            SHUNTS MV Decel Time: 182 msec            Systemic VTI:  0.07 m MV E velocity: 39.75 cm/s 103 cm/s Systemic Diam: 2.00 cm Loralie Champagne MD Electronically signed by Loralie Champagne MD Signature Date/Time: 11/22/2019/2:31:25 PM    Final    DG HIP PORT UNILAT WITH PELVIS 1V LEFT  Result Date: 11/24/2019 CLINICAL DATA:  Postop hip arthroplasty. EXAM: DG HIP (WITH OR WITHOUT PELVIS) 1V PORT LEFT COMPARISON:  11/22/2019. FINDINGS: Left hip arthroplasty. Femoral stem appears well seated. Surgical skin staples are in place. Subcutaneous air has decreased somewhat in the interval. Remainder of the visualized pelvis is unremarkable. IMPRESSION: Left hip arthroplasty with expected postoperative findings. Electronically Signed   By: Lorin Picket M.D.   On: 11/24/2019 11:10   DG HIP OPERATIVE UNILAT W OR W/O PELVIS LEFT  Result Date: 11/22/2019 CLINICAL DATA:  86 year old male status post left hip arthroplasty. EXAM: OPERATIVE left HIP (WITH PELVIS IF PERFORMED) 1 VIEWS TECHNIQUE: Fluoroscopic spot image(s) were submitted for interpretation post-operatively. COMPARISON:  Left hip radiograph dated 11/21/2019. FINDINGS: Spot fluoroscopic intraoperative images provided. The total fluoroscopic time is 6 seconds. There is a left hip arthroplasty. No immediate complication. IMPRESSION: Left hip arthroplasty. Electronically Signed   By: Anner Crete M.D.   On: 11/22/2019 17:00   DG Hip Unilat W or Wo Pelvis 2-3 Views Left  Result Date: 11/21/2019 CLINICAL DATA:   86 year old who fell and complains of LEFT hip and LEFT UPPER leg pain. Initial encounter. EXAM: DG HIP (WITH OR WITHOUT PELVIS) 2-3V LEFT COMPARISON:  None. FINDINGS: Acute subcapital or basicervical LEFT femoral neck fracture. LEFT hip joint anatomically aligned with well-preserved joint space for age. Bone mineral density well preserved for age. Included AP pelvis demonstrates no fractures elsewhere. Well preserved joint space in the contralateral RIGHT hip. Sacroiliac joints anatomically aligned without diastasis, demonstrating only mild degenerative changes. Symphysis pubis anatomically aligned without diastasis or degenerative changes. IMPRESSION: Acute subcapital or basicervical LEFT femoral neck fracture. Electronically Signed   By: Evangeline Dakin M.D.   On: 11/21/2019 12:28   DG Femur Min 2 Views Left  Result Date: 11/21/2019 CLINICAL DATA:  86 year old who fell and complains of LEFT hip and LEFT UPPER leg pain. Initial encounter. EXAM: LEFT FEMUR 2 VIEWS COMPARISON:  None. FINDINGS: Subcapital/basicervical LEFT femoral neck fracture as detailed on the report of the concurrent LEFT hip images. No fractures elsewhere involving the femur. Well-preserved bone mineral density. Femoropopliteal atherosclerosis. IMPRESSION: Subcapital/basicervical LEFT femoral neck fracture as detailed on the report of the concurrent LEFT hip images. No fractures elsewhere involving the femur. Electronically Signed   By: Evangeline Dakin M.D.   On: 11/21/2019 12:32    Lab Data:  CBC: Recent Labs  Lab 11/21/19 1123 11/22/19 0536 11/23/19 0634 11/24/19 0445 11/25/19 0625  WBC 11.1* 9.6 8.4 10.3 9.5  NEUTROABS 9.6*  --   --   --   --   HGB 15.3 13.9 13.6 12.1* 12.2*  HCT 47.4 43.8 43.4 37.3* 37.8*  MCV 94.0 95.0 96.9 96.1 96.7  PLT 190 157 141* 140* 161   Basic Metabolic Panel: Recent Labs  Lab 11/21/19 1123 11/22/19 0536 11/23/19 0634 11/24/19 0445 11/25/19 0625  NA 137 141 142 144 145  K 3.8 3.8  4.3 3.6 3.8  CL 105 110 113* 114* 110  CO2 26 23 25 25 26   GLUCOSE 144* 123* 115* 124* 135*  BUN 37* 34* 32* 34* 39*  CREATININE 1.50* 1.41* 1.41* 1.37* 1.23  CALCIUM 9.3 9.4 9.0 9.1 9.2   GFR: Estimated Creatinine Clearance: 42.9 mL/min (by C-G formula based on SCr of 1.23 mg/dL). Liver Function Tests: No results for input(s): AST, ALT, ALKPHOS, BILITOT, PROT, ALBUMIN in the last 168 hours. No results for input(s): LIPASE, AMYLASE in the last 168 hours. No results for input(s): AMMONIA in the last 168 hours. Coagulation Profile: No results for input(s): INR, PROTIME in the last 168 hours. Cardiac Enzymes: Recent Labs  Lab 11/21/19 1123  CKTOTAL 49   BNP (last 3 results) No results for input(s): PROBNP in the last 8760 hours. HbA1C: No results for input(s): HGBA1C in the last 72 hours. CBG: No results for input(s): GLUCAP in the last 168 hours. Lipid Profile: No results for input(s): CHOL, HDL, LDLCALC, TRIG, CHOLHDL, LDLDIRECT in the last 72 hours. Thyroid Function Tests: No results for input(s): TSH, T4TOTAL, FREET4, T3FREE, THYROIDAB in the last 72 hours. Anemia Panel: No results for input(s): VITAMINB12, FOLATE, FERRITIN, TIBC, IRON, RETICCTPCT in the last 72 hours. Urine analysis:    Component Value Date/Time   COLORURINE AMBER (A) 11/21/2019 1115   APPEARANCEUR CLEAR 11/21/2019 1115   LABSPEC 1.020 11/21/2019 1115   PHURINE 5.0 11/21/2019 1115   GLUCOSEU NEGATIVE 11/21/2019 1115   HGBUR SMALL (A) 11/21/2019 1115   BILIRUBINUR NEGATIVE 11/21/2019 1115   KETONESUR NEGATIVE 11/21/2019 1115   PROTEINUR NEGATIVE 11/21/2019 1115   NITRITE NEGATIVE 11/21/2019 1115   LEUKOCYTESUR TRACE (A) 11/21/2019 1115     Chanell Nadeau M.D. [facility] 11/25/2019, 3:04 PM   Call night coverage person covering after 7pm

## 2019-11-25 NOTE — Evaluation (Signed)
Occupational Therapy Evaluation Patient Details Name: Frank Gates. MRN: [id number] DOB: Mar 11, 1926 Today's Date: 11/25/2019    History of Present Illness 86 y.o. male with PMH significant for Alzheimer's dementia, HTN, HLD, CAD s/p CABG 2001, ischemic heart disease, mild systolic CHF, mild to moderate aortic stenosis. Patient presented secondary to fall, suffering a left hip fracture and s/p L hip hemiarthroplasty direct anterior approach.  CT performed 2/70, no complications.  Per PA's note:  L KI AAT   Clinical Impression   Pt was admitted for the above. Per PT notes, family wants to take pt home. He is total A at this time (+2).  Pt was lethargic during evaluation and did not follow any commands, but he did demonstrate some sitting balance after several minutes. Will follow in acute setting for family education, if they decide to proceed with pt going home    Follow Up Recommendations  SNF(if home, 24/7, [place] for education)    Equipment Recommendations  (tba further)    Recommendations for Other Services       Precautions / Restrictions Precautions Precautions: Fall Precaution Comments: L KI AAT (note 2/10) Restrictions Weight Bearing Restrictions: No      Mobility Bed Mobility         Supine to sit: Total assist;+2 for physical assistance Sit to supine: Total assist;+2 for physical assistance      Transfers                      Balance Overall balance assessment: History of Falls;Needs assistance     Sitting balance - Comments: min guard once he gained sitting balance with slight lean posteriorly and to R.  initially mod to max A to maintain upright sitting balance.  Pt lethargic this am, but did grunt and focus x 1                                   ADL either performed or assessed with clinical judgement   ADL Overall ADL's : Needs assistance/impaired                                       General ADL Comments: pt  needs total A (+2 for LB) adls.  Did not respond/carry over with hand over hand assist.  Pt spontaneously reached to rub eye with RUE; when washcloth slipped into hand, he stopped     Vision         Perception     Praxis      Pertinent Vitals/Pain Pain Assessment: Faces Faces Pain Scale: Hurts a little bit Pain Location: generalized Pain Descriptors / Indicators: Grimacing Pain Intervention(s): Limited activity within patient's tolerance;Monitored during session;Repositioned     Hand Dominance Right   Extremity/Trunk Assessment Upper Extremity Assessment Upper Extremity Assessment: Difficult to assess due to impaired cognition           Communication Communication Communication: (difficult to assess due to lethargy)   Cognition Arousal/Alertness: Lethargic;Suspect due to medications   Overall Cognitive Status: History of cognitive impairments - at baseline                                 General Comments: h/o dementia, not at baseline per PT note yesterday, when  family was present   General Comments  RUE with 2 marks from mitt; removed, used barrier cream (also on back) and RN alerted.  Left mitts off at end of session--RN aware    Exercises     Shoulder Instructions      Home Living Family/patient expects to be discharged to:: Unsure Living Arrangements: Children Available Help at Discharge: Family;Available 24 hours/day                                    Prior Functioning/Environment          Comments: unsure of PLOF for adls        OT Problem List: Decreased strength;Decreased activity tolerance;Decreased cognition;Pain;Impaired UE functional use;Impaired balance (sitting and/or standing)      OT Treatment/Interventions: Self-care/ADL training;DME and/or AE instruction;Patient/family education;Balance training;Therapeutic activities;Cognitive remediation/compensation    OT Goals(Current goals can be found in the care  plan section) Acute Rehab OT Goals OT Goal Formulation: Patient unable to participate in goal setting Time For Goal Achievement: 12/02/19 Potential to Achieve Goals: Good ADL Goals Additional ADL Goal #1: family will be independent with postiioning to decrease risk of skin breakdown. Additional ADL Goal #2: family will safely assist pt with adls from bed level or sit to stand, depending on pt's abilities  OT Frequency: Min 2X/week   Barriers to D/C:            Co-evaluation              AM-PAC OT "6 Clicks" Daily Activity     Outcome Measure Help from another person eating meals?: Total Help from another person taking care of personal grooming?: Total Help from another person toileting, which includes using toliet, bedpan, or urinal?: Total Help from another person bathing (including washing, rinsing, drying)?: Total Help from another person to put on and taking off regular upper body clothing?: Total Help from another person to put on and taking off regular lower body clothing?: Total 6 Click Score: 6   End of Session    Activity Tolerance: Patient limited by fatigue Patient left: in bed  OT Visit Diagnosis: Muscle weakness (generalized) (M62.81);Cognitive communication deficit (R41.841)                Time: 2542-7062 OT Time Calculation (min): 20 min Charges:  OT General Charges $OT Visit: 1 Visit OT Evaluation $OT Eval Low Complexity: 1 Low  Maneh Sieben S, OTR/L Acute Rehabilitation Services 11/25/2019  [name] 11/25/2019, 10:26 AM

## 2019-11-25 NOTE — Progress Notes (Addendum)
Progress Note  Patient Name: Frank Gates. Date of Encounter: 11/25/2019  Primary Cardiologist: Remote-Dr. Patty Sermons (08/2015)  Subjective   Working with PT. Difficult to arouse today. Denies chest pain.   Inpatient Medications    Scheduled Meds: . aspirin EC  325 mg Oral Q breakfast  . docusate sodium  100 mg Oral BID  . furosemide  20 mg Intravenous Daily  . senna  1 tablet Oral BID  . sodium chloride flush  3 mL Intravenous Q12H   Continuous Infusions: . lactated ringers 50 mL/hr at 11/22/19 1333   PRN Meds: acetaminophen, haloperidol lactate, HYDROcodone-acetaminophen, HYDROcodone-acetaminophen, menthol-cetylpyridinium **OR** phenol, metoCLOPramide **OR** metoCLOPramide (REGLAN) injection, morphine injection, morphine injection, ondansetron **OR** ondansetron (ZOFRAN) IV, sodium phosphate, sorbitol   Vital Signs    Vitals:   11/24/19 0533 11/24/19 1423 11/24/19 2037 11/25/19 0443  BP: 90/65 107/61 105/68 106/60  Pulse: 91 90 (!) 101 83  Resp: 19 16 18 18   Temp: 98 F (36.7 C) 98.2 F (36.8 C) 98.6 F (37 C) 98.9 F (37.2 C)  TempSrc: Oral Oral Oral Oral  SpO2: 97% 100% 100% 99%  Weight:      Height:        Intake/Output Summary (Last 24 hours) at 11/25/2019 0755 Last data filed at 11/25/2019 0444 Gross per 24 hour  Intake 120 ml  Output 1550 ml  Net -1430 ml   Filed Weights   11/23/19 1112  Weight: 82.5 kg    Physical Exam   General: Elderly, NAD Head: Normocephalic, atraumatic, clear, moist mucus membranes. Neck: Negative for carotid bruits. No JVD Lungs:Clear to ausculation bilaterally. Breathing is unlabored. Cardiovascular: RRR with S1 S2. No murmurs Abdomen: Soft, non-tender, non-distended. No obvious abdominal masses. Extremities: No edema. DP pulses 2+ bilaterally Neuro: Alert and oriented to se,lf only. No focal deficits. No facial asymmetry. MAE spontaneously. Psych: Responds to questions inappropriately with normal affect.     Labs    Chemistry Recent Labs  Lab 11/23/19 [phone] 11/24/19 0445 11/25/19 0625  NA 142 144 145  K 4.3 3.6 3.8  CL 113* 114* 110  CO2 25 25 26   GLUCOSE 115* 124* 135*  BUN 32* 34* 39*  CREATININE 1.41* 1.37* 1.23  CALCIUM 9.0 9.1 9.2  GFRNONAA 43* 45* 51*  GFRAA 50* 52* 59*  ANIONGAP 4* 5 9     Hematology Recent Labs  Lab 11/23/19 0634 11/24/19 0445 11/25/19 0625  WBC 8.4 10.3 9.5  RBC 4.48 3.88* 3.91*  HGB 13.6 12.1* 12.2*  HCT 43.4 37.3* 37.8*  MCV 96.9 96.1 96.7  MCH 30.4 31.2 31.2  MCHC 31.3 32.4 32.3  RDW 13.1 13.1 12.9  PLT 141* 140* 157    Cardiac EnzymesNo results for input(s): TROPONINI in the last 168 hours. No results for input(s): TROPIPOC in the last 168 hours.   BNPNo results for input(s): BNP, PROBNP in the last 168 hours.   DDimer No results for input(s): DDIMER in the last 168 hours.   Radiology    CT HIP LEFT WO CONTRAST  Result Date: 11/24/2019 CLINICAL DATA:  Left hip fracture EXAM: CT OF THE LEFT HIP WITHOUT CONTRAST TECHNIQUE: Multidetector CT imaging of the left hip was performed according to the standard protocol. Multiplanar CT image reconstructions were also generated. COMPARISON:  Multiple priors most recent same day radiograph FINDINGS: Bones/Joint/Cartilage The patient is status post left total hip arthroplasty. No periprosthetic lucency or acute fracture is identified. The femoral head prosthesis is well seated  within the acetabular component. There is slight anterior rotation of the femoral shaft. A moderate hip joint effusion is seen. Ligaments Suboptimally assessed by CT. Muscles and Tendons There is diffuse subcutaneous emphysema seen surrounding the hip extending into the adductor, anterior, gluteal musculature. There is limited visualization of the tendons, however no displaced tendon rupture seen. Soft tissues Soft tissue swelling with subcutaneous emphysema seen overlying the lateral hip. The sacroiliac joint is intact. There is  diffuse osteopenia. The visualized deep pelvis is unremarkable. Scattered dense vascular calcifications are noted. IMPRESSION: Status post left total hip arthroplasty without complication. Electronically Signed   By: Jonna Clark M.D.   On: 11/24/2019 19:09   DG CHEST PORT 1 VIEW  Result Date: 11/23/2019 CLINICAL DATA:  Sudden onset of tachypnea. Hypertension. EXAM: PORTABLE CHEST 1 VIEW COMPARISON:  Radiograph 11/21/2019 FINDINGS: Post median sternotomy and CABG. Stable cardiomegaly and mediastinal contours. Aortic atherosclerosis. Mild vascular congestion without overt edema. Questionable retrocardiac opacity versus overlapping soft tissue structures. No pneumothorax. No acute osseous abnormalities are seen. IMPRESSION: Development of vascular congestion over the past 2 days. Cardiomegaly is stable. Questionable retrocardiac opacity/pleural effusion versus overlapping soft tissue structures. Electronically Signed   By: Narda Rutherford M.D.   On: 11/23/2019 14:13   DG HIP PORT UNILAT WITH PELVIS 1V LEFT  Result Date: 11/24/2019 CLINICAL DATA:  Postop hip arthroplasty. EXAM: DG HIP (WITH OR WITHOUT PELVIS) 1V PORT LEFT COMPARISON:  11/22/2019. FINDINGS: Left hip arthroplasty. Femoral stem appears well seated. Surgical skin staples are in place. Subcutaneous air has decreased somewhat in the interval. Remainder of the visualized pelvis is unremarkable. IMPRESSION: Left hip arthroplasty with expected postoperative findings. Electronically Signed   By: Leanna Battles M.D.   On: 11/24/2019 11:10   Telemetry     11/25/19 NSR with HR in the 80-90's- Personally Reviewed  ECG    No new tracings as of 11/25/19 - Personally Reviewed  Cardiac Studies   Echocardiogram 11/22/2019:  1. Left ventricular ejection fraction, by visual estimation, is <20%. The  left ventricle has severely decreased function. There is no left  ventricular hypertrophy. Indeterminant diastolic function.  2. Definity contrast agent  was given IV to delineate the left ventricular  endocardial borders.  3. The left ventricle demonstrates global hypokinesis.  4. Global right ventricle has moderately reduced systolic function.The  right ventricular size is normal. No increase in right ventricular wall  thickness.  5. Left atrial size was mild-moderately dilated.  6. Right atrial size was mildly dilated.  7. Mild mitral annular calcification.  8. The mitral valve is normal in structure. Mild mitral valve  regurgitation. No evidence of mitral stenosis.  9. The tricuspid valve is normal in structure. Tricuspid valve  regurgitation is trivial.  10. The aortic valve is tricuspid. Aortic valve regurgitation is mild. At  lease mild aortic valve stenosis, mean gradient only measured at 5 mmHg  but suspect this is due to low EF.  11. TR signal is inadequate for assessing pulmonary artery systolic  pressure.   Patient Profile     86 y.o. male with a hx of Alzheimer's dementia, CAD s/p CABG 2001, essential hypertension, mild to moderate aortic stenosis per echocardiogram 04/2007 and hyperlipidemia who is being seen today for the evaluation of new severely reduced LV function at 20% at the request of Dr. Caleb Popp.  Assessment & Plan    1.  Severe systolic cardiomyopathy with known CAD s/p CABG 2001: -Last echocardiogram from 2008 on file in epic with  mildly reduced LV function.  Given history of cardiovascular disease, echocardiogram performed prior to left hip surgical repair. Unfortunately, LV found to be severely reduced at less than 20% with global hypokinesis. -In the absence of CV symptoms along with advanced age and recent left hip fracture limiting antiplatelet therapies would opt for medical management for systolic dysfunction at this time.  -Currently on ASA 325 mg p.o. daily given recent hip fracture repair -Once okay with orthopedic team, will reduce to ASA 81 mg p.o. daily -Creatinine improve from 1.37 to 1.23  today. Ideally would like to add ACEI/ARB>>BP continues to be a little too low to initiate at this time   -Appears euvolemic on exam -Continue IV Lasix >>>creatinine improving   2.  Left hip fracture with repair: -Patient initially presented to [facility] after sustaining a mechanical, unwitnessed fall at his home where he lives with his son.  Imaging on ED presentation revealed left femur fracture.  Echocardiogram performed given his history of CAD which revealed a severely reduced LVEF less than 20%.  Given no CV symptoms, patient was taken to the OR for surgical repair without complication. -Management per orthopedics, primary team  3.  Hypertension: -Low, 106/60>105/68>107/61 -Not currently on antihypertensive medications -Would benefit from the addition of ACE/ARB however BP remain a little soft at this time   4.  Mild aortic stenosis: -Previous echocardiogram with mild to moderate aortic stenosis however echocardiogram this hospital admission with evidence of mild AS with a mean gradient of 5 mmHg -Unfortunately shows severe LV dysfunction with an LVEF of less than 20% and global hypokinesis -Previous echocardiogram performed in 2008, scanned images in epic -No symptoms at this time  5.  HLD: -LDL, 94 from 08/28/2015 -Not currently on statin therapy  6.  CKD stage II: -Baseline creatinine appears to be in the 1.2-1.4 range -Creatinine today, 1.23 today, down from 1.37 on 11/24/19 -Could consider low-dose ACE/ARB for guideline directed therapy however would need to be mindful of renal dysfunction  Signed, Kathyrn Drown NP-C [place] Pager: [phone] 11/25/2019, 7:55 AM     For questions or updates, please contact   Please consult [email or web address] for contact info under Cardiology/STEMI.   History and all data above reviewed.  Patient examined.  I agree with the findings as above.   He has some.  He responds appropriately.  No distress.  The patient exam  reveals COR: Regular rate and rhythm with ectopy,  Lungs: Decreased breath sounds but no crackles,  Abd: Positive bowel sounds, Ext no edema.  All available labs, radiology testing, previous records reviewed. Agree with documented assessment and plan. CHF: Conservative therapy.  Continue low-dose diuretic IVF we will switch to p.o.  Jeneen Rinks Brittanee Ghazarian  2:07 PM  11/25/2019

## 2019-11-25 NOTE — Progress Notes (Signed)
Physical Therapy Treatment Patient Details Name: Frank Gates. MRN: [id number] DOB: 12-Jan-1926 Today's Date: 11/25/2019    History of Present Illness 86 y.o. male with PMH significant for Alzheimer's dementia, HTN, HLD, CAD s/p CABG 2001, ischemic heart disease, mild systolic CHF, mild to moderate aortic stenosis. Patient presented secondary to fall, suffering a left hip fracture and s/p L hip hemiarthroplasty direct anterior approach.  CT performed 7/34, no complications.  Per PA's note:  L KI AAT    PT Comments    Pt very lethargic and minimally engaging this session, suspect due to pain medications. Pt continues to require total +2 for bed-level activity only, focus of PT session was on sitting EOB balance and righting reactions. PT feels pt is SNF appropriate, vs HHPT with 24/7 if family can provide total assistance to pt. Will continue to follow acutely.    Follow Up Recommendations  SNF;Supervision/Assistance - 24 hour(family prefers home so HHPT)     Equipment Recommendations  Wheelchair cushion (measurements PT);Wheelchair (measurements PT);3in1 (PT)    Recommendations for Other Services       Precautions / Restrictions Precautions Precautions: Fall Precaution Comments: L KI AAT (note 2/10) Restrictions Weight Bearing Restrictions: No    Mobility  Bed Mobility Overal bed mobility: Needs Assistance Bed Mobility: Supine to Sit;Sit to Supine     Supine to sit: Total assist;+2 for physical assistance Sit to supine: Total assist;+2 for physical assistance   General bed mobility comments: total +2 for supine<>sit, use of helicopter method with bed pads because pt contributing 0% with mobility.  Transfers                 General transfer comment: unable - pt with decreased arousal this session.  Ambulation/Gait                 Stairs             Wheelchair Mobility    Modified Rankin (Stroke Patients Only)       Balance Overall  balance assessment: History of Falls;Needs assistance     Sitting balance - Comments: min guard once he gained sitting balance with slight lean posteriorly and to L, sat EOB ~19 minutes for application of barrier cream  initially mod to max A to maintain upright sitting balance.  Pt lethargic this am, but did grunt and focus x 1 Postural control: Posterior lean;Left lateral lean     Standing balance comment: unable to assess                            Cognition Arousal/Alertness: Lethargic;Suspect due to medications Behavior During Therapy: Flat affect Overall Cognitive Status: History of cognitive impairments - at baseline                                 General Comments: h/o dementia, not at baseline per PT note yesterday, when family was present. Pt not following commands this session, periods of visual locking with PT but brief.      Exercises      General Comments General comments (skin integrity, edema, etc.): RUE with 2 marks from mitt; removed, used barrier cream (also on back) and RN alerted.  Left mitts off at end of session--RN aware      Pertinent Vitals/Pain Pain Assessment: Faces Faces Pain Scale: Hurts a little bit Pain Location: generalized Pain Descriptors /  Indicators: Grimacing Pain Intervention(s): Limited activity within patient's tolerance;Monitored during session;Repositioned    Home Living Family/patient expects to be discharged to:: Unsure Living Arrangements: Children Available Help at Discharge: Family;Available 24 hours/day                Prior Function        Comments: unsure of PLOF for adls   PT Goals (current goals can now be found in the care plan section) Acute Rehab PT Goals PT Goal Formulation: Patient unable to participate in goal setting Time For Goal Achievement: 12/07/19 Potential to Achieve Goals: Fair Progress towards PT goals: Not progressing toward goals - comment(total +2 for mobility)     Frequency    Min 5X/week      PT Plan Current plan remains appropriate    Co-evaluation PT/OT/SLP Co-Evaluation/Treatment: Yes Reason for Co-Treatment: For patient/therapist safety;To address functional/ADL transfers PT goals addressed during session: Mobility/safety with mobility;Balance        AM-PAC PT "6 Clicks" Mobility   Outcome Measure  Help needed turning from your back to your side while in a flat bed without using bedrails?: Total Help needed moving from lying on your back to sitting on the side of a flat bed without using bedrails?: Total Help needed moving to and from a bed to a chair (including a wheelchair)?: Total Help needed standing up from a chair using your arms (e.g., wheelchair or bedside chair)?: Total Help needed to walk in hospital room?: Total Help needed climbing 3-5 steps with a railing? : Total 6 Click Score: 6    End of Session Equipment Utilized During Treatment: Gait belt Activity Tolerance: Patient tolerated treatment well Patient left: with call bell/phone within reach;in bed;with bed alarm set;with family/visitor present Nurse Communication: Mobility status PT Visit Diagnosis: Other abnormalities of gait and mobility (R26.89)     Time: 0175-1025 PT Time Calculation (min) (ACUTE ONLY): 20 min  Charges:  $Neuromuscular Re-education: 8-22 mins                     Jakya Dovidio E, PT Acute Rehabilitation Services Pager [phone]  Office [phone]    [name] 11/25/2019, 11:48 AM

## 2019-11-25 NOTE — Telephone Encounter (Signed)
ERROR

## 2019-11-26 DIAGNOSIS — I350 Nonrheumatic aortic (valve) stenosis: Secondary | ICD-10-CM

## 2019-11-26 MED ORDER — CEPHALEXIN 500 MG PO CAPS: 500.0000 mg | ORAL_CAPSULE | Freq: Two times a day (BID) | ORAL | 0 refills | Status: AC

## 2019-11-26 MED ORDER — ACETAMINOPHEN 325 MG PO TABS: 325.0000 mg | ORAL_TABLET | Freq: Four times a day (QID) | ORAL | Status: AC | PRN

## 2019-11-26 MED ORDER — DOCUSATE SODIUM 100 MG PO CAPS: 100.0000 mg | ORAL_CAPSULE | Freq: Two times a day (BID) | ORAL | 0 refills | Status: AC | PRN

## 2019-11-26 MED ORDER — CEPHALEXIN 500 MG PO CAPS
500.0000 mg | ORAL_CAPSULE | Freq: Two times a day (BID) | ORAL | Status: DC
  Administered 2019-11-26: 500 mg via ORAL
  Filled 2019-11-26: qty 1

## 2019-11-26 MED ORDER — OXYCODONE-ACETAMINOPHEN 5-325 MG PO TABS: 1.0000 | ORAL_TABLET | Freq: Three times a day (TID) | ORAL | 0 refills | Status: AC | PRN

## 2019-11-26 MED ORDER — ASPIRIN 325 MG PO TBEC: 325.0000 mg | DELAYED_RELEASE_TABLET | Freq: Every day | ORAL | 0 refills | Status: AC

## 2019-11-26 NOTE — Progress Notes (Signed)
Nsg Discharge Note  Admit Date:  11/21/2019 Discharge date: 11/26/2019   Christiane Ha. to be D/C'd Home per MD order.  AVS completed.  Copy for chart, and copy for patient signed, and dated. Patient/caregiver able to verbalize understanding.  Discharge Medication: Allergies as of 11/26/2019   No Known Allergies     Medication List    TAKE these medications   acetaminophen 325 MG tablet Commonly known as: TYLENOL Take 1-2 tablets (325-650 mg total) by mouth every 6 (six) hours as needed for mild pain or moderate pain.   aspirin 325 MG EC tablet Take 1 tablet (325 mg total) by mouth daily with breakfast.   cephALEXin 500 MG capsule Commonly known as: KEFLEX Take 1 capsule (500 mg total) by mouth 2 (two) times daily for 6 days.   docusate sodium 100 MG capsule Commonly known as: COLACE Take 1 capsule (100 mg total) by mouth 2 (two) times daily as needed for mild constipation or moderate constipation.   furosemide 20 MG tablet Commonly known as: LASIX Take 20 mg by mouth daily.   oxyCODONE-acetaminophen 5-325 MG tablet Commonly known as: Percocet Take 1 tablet by mouth every 8 (eight) hours as needed for severe pain.            Durable Medical Equipment  (From admission, onward)         Start     Ordered   11/26/19 1017  For home use only DME 3 n 1  Once     11/26/19 1016   11/26/19 1017  For home use only DME Walker rolling  Once    Question Answer Comment  Walker: Other   Comments 2 wheels   Patient needs a walker to treat with the following condition Closed left hip fracture (HCC)      11/26/19 1016          Discharge Assessment: Vitals:   11/25/19 2018 11/26/19 0612  BP: 103/68 (!) 117/92  Pulse: (!) 101 100  Resp: 18 20  Temp: 98.1 F (36.7 C) 97.7 F (36.5 C)  SpO2: 100% 100%   Skin clean, dry and intact without evidence of skin break down, no evidence of skin tears noted. IV catheter discontinued intact. Site without signs and symptoms of  complications - no redness or edema noted at insertion site, patient denies c/o pain - only slight tenderness at site.  Dressing with slight pressure applied.  D/c Instructions-Education: Discharge instructions given to patient/family with verbalized understanding. D/c education completed with patient/family including follow up instructions, medication list, d/c activities limitations if indicated, with other d/c instructions as indicated by MD - patient's son was able to verbalize understanding, all questions fully answered. Patient instructed to return to ED, call 911, or call MD for any changes in condition.  Patient escorted via stretcher, and D/C home via ambulance services.  Robert Bellow, RN 11/26/2019 12:31 PM

## 2019-11-26 NOTE — Discharge Summary (Addendum)
Physician Discharge Summary   Patient ID: Frank Gates. MRN: [id number] DOB/AGE: 05-12-1926 86 y.o.  Admit date: 11/21/2019 Discharge date: 11/26/2019  Primary Care Physician:  Merlene Laughter, MD   Recommendations for Outpatient Follow-up:  1. Follow up with PCP in 1-2 weeks 2. Follow-up with Dr. Magnus Ivan in 2 weeks 3. Outpatient palliative care recommended  Home Health: Home health PT OT Equipment/Devices: DME 3n 1, rolling walker  Discharge Condition: stable  CODE STATUS:  DNR   Diet recommendation: heart healthy diet   Discharge Diagnoses:    .  Left Hip fracture (HCC) . CAD (coronary artery disease) Severe systolic cardiomyopathy, EF 20% acute systolic CHF Acute respiratory failure with hypoxia . HTN (hypertension) . Hypercholesterolemia E Coli UTI  Alzheimer's dementia  Consults:   Cardiology Orthopedics  Palliative medicine   Allergies:  No Known Allergies   DISCHARGE MEDICATIONS: Allergies as of 11/26/2019   No Known Allergies     Medication List    TAKE these medications   acetaminophen 325 MG tablet Commonly known as: TYLENOL Take 1-2 tablets (325-650 mg total) by mouth every 6 (six) hours as needed for mild pain or moderate pain.   aspirin 325 MG EC tablet Take 1 tablet (325 mg total) by mouth daily with breakfast.   cephALEXin 500 MG capsule Commonly known as: KEFLEX Take 1 capsule (500 mg total) by mouth 2 (two) times daily for 6 days.   docusate sodium 100 MG capsule Commonly known as: COLACE Take 1 capsule (100 mg total) by mouth 2 (two) times daily as needed for mild constipation or moderate constipation.   furosemide 20 MG tablet Commonly known as: LASIX Take 20 mg by mouth daily.   oxyCODONE-acetaminophen 5-325 MG tablet Commonly known as: Percocet Take 1 tablet by mouth every 8 (eight) hours as needed for severe pain.            Durable Medical Equipment  (From admission, onward)         Start     Ordered    11/26/19 1017  For home use only DME 3 n 1  Once     11/26/19 1016   11/26/19 1017  For home use only DME Walker rolling  Once    Question Answer Comment  Walker: Other   Comments 2 wheels   Patient needs a walker to treat with the following condition Closed left hip fracture (HCC)      11/26/19 1016           Brief H and P: For complete details please refer to admission H and P, but in brief Frank Gatesis a 86 y.o.malewith PMH significant for Alzheimer's dementia, HTN, HLD, CADs/p CABG 2001,ischemic heart disease, mild systolic CHF,mild to moderate aortic stenosis. Patient presented secondary to fall, suffering a left hip fracture.  Hospital Course:  Left hip displaced femoral neck fracture -Secondary to mechanical fall.  Underlying history of dementia.  At baseline ambulatory with a cane. -Orthopedics was consulted, underwent left hip hemiarthroplasty, postop day #4 --CT of the left hip was repeated, showed no acute changes.  -Continue aspirin for DVT prophylaxis for 30 days, Tylenol as needed for mild pain, patient's daughter recommended to use Percocet sparingly if needed for physical therapy sessions at home   Alzheimer's dementia -Resides at home with his son and caregivers  Severe systolic cardiomyopathy with known CAD status post CABG 2001, acute systolic CHF, acute respiratory failure with hypoxia - Given history of CVD, 2D echo  performed prior to left hip surgical repair showed severely reduced LV function less than 20% with global hypokinesis. - Cardiology consulted, recommended gentle diuresis, conservative management given 86 age, comorbidities, dementia -Lasix transition to p.o. 20 mg daily BP has been soft, recommend outpatient follow-up with cardiology to consider low-dose ACE/ARB -Palliative medicine was consulted for goals of care, will follow outpatient palliative care  Hypertension BP soft but stable  Mild to moderate aortic stenosis -Currently  no acute symptoms  CKD stage IIIb Baseline creatinine 1.2-1.4 range,  Creatinine improving, at baseline  E. coli UTI Reviewed urine culture, had shown 100,000 colonies of E. coli, placed on Keflex for total of 7 days        Day of Discharge S: Much more alert and awake today, no acute issues overnight.  BP (!) 117/92 (BP Location: Left Arm)   Pulse 100   Temp 97.7 F (36.5 C) (Oral)   Resp 20   Ht 6' 0.01" (1.829 m)   Wt 82.5 kg   SpO2 100%   BMI 24.66 kg/m   Physical Exam: General: Alert and awake, has dementia HEENT: anicteric sclera, pupils reactive to light and accommodation CVS: S1-S2 clear no murmur rubs or gallops Chest: clear to auscultation bilaterally, no wheezing rales or rhonchi Abdomen: soft nontender, nondistended, normal bowel sounds Extremities: no cyanosis, clubbing or edema noted bilaterally Neuro: Moving upper extremities spontaneously, left leg in immobilizer     Please note  You were cared for by a hospitalist during your hospital stay. Once you are discharged, your primary care physician will handle any further medical issues. Please note that NO REFILLS for any discharge medications will be authorized once you are discharged, as it is imperative that you return to your primary care physician (or establish a relationship with a primary care physician if you do not have one) for your aftercare needs so that they can reassess your need for medications and monitor your lab values.   The results of significant diagnostics from this hospitalization (including imaging, microbiology, ancillary and laboratory) are listed below for reference.      Procedures/Studies:  DG Knee 1-2 Views Left  Result Date: 11/21/2019 CLINICAL DATA:  86 year old who fell and complains of LEFT hip and LEFT UPPER leg pain. Initial encounter. EXAM: LEFT KNEE - 1-2 VIEW COMPARISON:  None. FINDINGS: No evidence of acute fracture or dislocation. Moderate MEDIAL and LATERAL  compartment joint space narrowing. Patellofemoral joint space well-preserved. Enthesopathic spurring at the insertion of the quadriceps tendon on the SUPERIOR patella and at the insertion of the patellar tendon on the INFERIOR patella. No visible joint effusion. Femoropopliteal atherosclerosis. IMPRESSION: 1. No acute osseous abnormality. 2. Moderate MEDIAL and LATERAL compartment osteoarthritis. Electronically Signed   By: Hulan Saas M.D.   On: 11/21/2019 12:30   CT Head Wo Contrast  Result Date: 11/21/2019 CLINICAL DATA:  Unwitnessed fall. EXAM: CT HEAD WITHOUT CONTRAST CT CERVICAL SPINE WITHOUT CONTRAST TECHNIQUE: Multidetector CT imaging of the head and cervical spine was performed following the standard protocol without intravenous contrast. Multiplanar CT image reconstructions of the cervical spine were also generated. COMPARISON:  October 25, 2010. FINDINGS: CT HEAD FINDINGS Brain: Mild diffuse cortical atrophy is noted. Mild chronic ischemic white matter disease is noted. No mass effect or midline shift is noted. Ventricular size is within normal limits. There is no evidence of mass lesion, hemorrhage or acute infarction. Vascular: No hyperdense vessel or unexpected calcification. Skull: Normal. Negative for fracture or focal lesion. Sinuses/Orbits: No acute  finding. Other: None. CT CERVICAL SPINE FINDINGS Alignment: Normal. Skull base and vertebrae: No acute fracture. No primary bone lesion or focal pathologic process. Soft tissues and spinal canal: No prevertebral fluid or swelling. No visible canal hematoma. Disc levels: Mild degenerative disc disease is noted at C4-5, C5-6 and C6-7. Upper chest: Negative. Other: Degenerative changes are seen involving posterior facet joints bilaterally. IMPRESSION: 1. Mild diffuse cortical atrophy. Mild chronic ischemic white matter disease. No acute intracranial abnormality seen. 2. Multilevel degenerative disc disease. No acute abnormality seen in the cervical  spine. Electronically Signed   By: Lupita RaiderJames  Green Jr M.D.   On: 11/21/2019 12:36   CT Cervical Spine Wo Contrast  Result Date: 11/21/2019 CLINICAL DATA:  Unwitnessed fall. EXAM: CT HEAD WITHOUT CONTRAST CT CERVICAL SPINE WITHOUT CONTRAST TECHNIQUE: Multidetector CT imaging of the head and cervical spine was performed following the standard protocol without intravenous contrast. Multiplanar CT image reconstructions of the cervical spine were also generated. COMPARISON:  October 25, 2010. FINDINGS: CT HEAD FINDINGS Brain: Mild diffuse cortical atrophy is noted. Mild chronic ischemic white matter disease is noted. No mass effect or midline shift is noted. Ventricular size is within normal limits. There is no evidence of mass lesion, hemorrhage or acute infarction. Vascular: No hyperdense vessel or unexpected calcification. Skull: Normal. Negative for fracture or focal lesion. Sinuses/Orbits: No acute finding. Other: None. CT CERVICAL SPINE FINDINGS Alignment: Normal. Skull base and vertebrae: No acute fracture. No primary bone lesion or focal pathologic process. Soft tissues and spinal canal: No prevertebral fluid or swelling. No visible canal hematoma. Disc levels: Mild degenerative disc disease is noted at C4-5, C5-6 and C6-7. Upper chest: Negative. Other: Degenerative changes are seen involving posterior facet joints bilaterally. IMPRESSION: 1. Mild diffuse cortical atrophy. Mild chronic ischemic white matter disease. No acute intracranial abnormality seen. 2. Multilevel degenerative disc disease. No acute abnormality seen in the cervical spine. Electronically Signed   By: Lupita RaiderJames  Green Jr M.D.   On: 11/21/2019 12:36   Pelvis Portable  Result Date: 11/22/2019 CLINICAL DATA:  Left hip arthroplasty EXAM: PORTABLE PELVIS 1-2 VIEWS COMPARISON:  11/22/2019 FINDINGS: Single frontal view of the pelvis excludes the iliac crests by collimation. Left hip hemiarthroplasty is identified in expected position without signs of  acute complication. Postsurgical changes are seen within the soft tissues. IMPRESSION: 1. Left hip hemiarthroplasty as above. Electronically Signed   By: Sharlet SalinaMichael  Brown M.D.   On: 11/22/2019 17:15   CT HIP LEFT WO CONTRAST  Result Date: 11/24/2019 CLINICAL DATA:  Left hip fracture EXAM: CT OF THE LEFT HIP WITHOUT CONTRAST TECHNIQUE: Multidetector CT imaging of the left hip was performed according to the standard protocol. Multiplanar CT image reconstructions were also generated. COMPARISON:  Multiple priors most recent same day radiograph FINDINGS: Bones/Joint/Cartilage The patient is status post left total hip arthroplasty. No periprosthetic lucency or acute fracture is identified. The femoral head prosthesis is well seated within the acetabular component. There is slight anterior rotation of the femoral shaft. A moderate hip joint effusion is seen. Ligaments Suboptimally assessed by CT. Muscles and Tendons There is diffuse subcutaneous emphysema seen surrounding the hip extending into the adductor, anterior, gluteal musculature. There is limited visualization of the tendons, however no displaced tendon rupture seen. Soft tissues Soft tissue swelling with subcutaneous emphysema seen overlying the lateral hip. The sacroiliac joint is intact. There is diffuse osteopenia. The visualized deep pelvis is unremarkable. Scattered dense vascular calcifications are noted. IMPRESSION: Status post left total hip arthroplasty  without complication. Electronically Signed   By: Jonna ClarkBindu  Avutu M.D.   On: 11/24/2019 19:09   DG CHEST PORT 1 VIEW  Result Date: 11/23/2019 CLINICAL DATA:  Sudden onset of tachypnea. Hypertension. EXAM: PORTABLE CHEST 1 VIEW COMPARISON:  Radiograph 11/21/2019 FINDINGS: Post median sternotomy and CABG. Stable cardiomegaly and mediastinal contours. Aortic atherosclerosis. Mild vascular congestion without overt edema. Questionable retrocardiac opacity versus overlapping soft tissue structures. No  pneumothorax. No acute osseous abnormalities are seen. IMPRESSION: Development of vascular congestion over the past 2 days. Cardiomegaly is stable. Questionable retrocardiac opacity/pleural effusion versus overlapping soft tissue structures. Electronically Signed   By: Narda RutherfordMelanie  Sanford M.D.   On: 11/23/2019 14:13   DG Chest Portable 1 View  Result Date: 11/21/2019 CLINICAL DATA:  Preop. EXAM: PORTABLE CHEST 1 VIEW COMPARISON:  May 29, 2011. FINDINGS: Mild cardiomegaly is noted. No pneumothorax or pleural effusion is noted. Status post coronary bypass graft. Both lungs are clear. The visualized skeletal structures are unremarkable. IMPRESSION: No active disease. Electronically Signed   By: Lupita RaiderJames  Green Jr M.D.   On: 11/21/2019 13:52   DG C-Arm 1-60 Min-No Report  Result Date: 11/22/2019 Fluoroscopy was utilized by the requesting physician.  No radiographic interpretation.   ECHOCARDIOGRAM COMPLETE  Result Date: 11/22/2019    ECHOCARDIOGRAM REPORT   Patient Name:   Frank HaHarry R Belling Jr. Date of Exam: 11/22/2019 Medical Rec #:  [id number]         Height:       72.0 in Accession #:    [id number]        Weight:       181.8 lb Date of Birth:  1926/06/17         BSA:          2.05 m Patient Age:    89 years          BP:           139/100 mmHg Patient Gender: M                 HR:           85 bpm. Exam Location:  Inpatient Procedure: 2D Echo and Intracardiac Opacification Agent Indications:    CHF-Acute Diastolic 428.31 / I50.31  History:        Patient has prior history of Echocardiogram examinations, most                 recent 04/29/2007. CAD, Prior CABG; Risk Factors:Hypertension and                 Dyslipidemia. Alzheimer's dementia, Left femoral fracture.  Sonographer:    Leta Junglingiffany Cooper RDCS Referring Phys: [id number] [facility] DA[facility] IMPRESSIONS  1. Left ventricular ejection fraction, by visual estimation, is <20%. The left ventricle has severely decreased function. There is no left ventricular hypertrophy.  Indeterminant diastolic function.  2. Definity contrast agent was given IV to delineate the left ventricular endocardial borders.  3. The left ventricle demonstrates global hypokinesis.  4. Global right ventricle has moderately reduced systolic function.The right ventricular size is normal. No increase in right ventricular wall thickness.  5. Left atrial size was mild-moderately dilated.  6. Right atrial size was mildly dilated.  7. Mild mitral annular calcification.  8. The mitral valve is normal in structure. Mild mitral valve regurgitation. No evidence of mitral stenosis.  9. The tricuspid valve is normal in structure. Tricuspid valve regurgitation is trivial. 10. The aortic valve is tricuspid. Aortic valve regurgitation is  mild. At lease mild aortic valve stenosis, mean gradient only measured at 5 mmHg but suspect this is due to low EF. 11. TR signal is inadequate for assessing pulmonary artery systolic pressure. FINDINGS  Left Ventricle: Left ventricular ejection fraction, by visual estimation, is <20%. The left ventricle has severely decreased function. The left ventricle demonstrates global hypokinesis. Definity contrast agent was given IV to delineate the left ventricular endocardial borders. The left ventricular internal cavity size was the left ventricle is normal in size. There is no left ventricular hypertrophy. Right Ventricle: The right ventricular size is normal. No increase in right ventricular wall thickness. Global RV systolic function is has moderately reduced systolic function. Left Atrium: Left atrial size was mild-moderately dilated. Right Atrium: Right atrial size was mildly dilated Pericardium: There is no evidence of pericardial effusion. Mitral Valve: The mitral valve is normal in structure. There is mild calcification of the mitral valve leaflet(s). Mild mitral annular calcification. Mild mitral valve regurgitation. No evidence of mitral valve stenosis by observation. Tricuspid Valve: The  tricuspid valve is normal in structure. Tricuspid valve regurgitation is trivial. Aortic Valve: The aortic valve is tricuspid. Aortic valve regurgitation is mild. Mild aortic stenosis is present. Aortic valve mean gradient measures 4.5 mmHg. Aortic valve peak gradient measures 7.2 mmHg. Aortic valve area, by VTI measures 1.16 cm. Pulmonic Valve: The pulmonic valve was normal in structure. Pulmonic valve regurgitation is not visualized. Aorta: The aortic root is normal in size and structure. Venous: The inferior vena cava was not well visualized. IAS/Shunts: No atrial level shunt detected by color flow Doppler.  LEFT VENTRICLE PLAX 2D LVIDd:         5.30 cm       Diastology LVIDs:         4.80 cm       LV e' lateral:   6.86 cm/s LV PW:         1.00 cm       LV E/e' lateral: 5.8 LV IVS:        1.10 cm       LV e' medial:    8.00 cm/s LVOT diam:     2.00 cm       LV E/e' medial:  5.0 LV SV:         22.65 ml LV SV Index:   13.56 LVOT Area:     3.14 cm  LV Volumes (MOD) LV area d, A2C:    47.30 cm LV area d, A4C:    36.50 cm LV area s, A2C:    36.50 cm LV area s, A4C:    27.70 cm LV major d, A2C:   8.93 cm LV major d, A4C:   7.45 cm LV major s, A2C:   7.53 cm LV major s, A4C:   6.41 cm LV vol d, MOD A2C: 204.0 ml LV vol d, MOD A4C: 143.0 ml LV vol s, MOD A2C: 142.0 ml LV vol s, MOD A4C: 98.3 ml LV SV MOD A2C:     62.0 ml LV SV MOD A4C:     143.0 ml LV SV MOD BP:      59.0 ml RIGHT VENTRICLE RV S prime:     5.70 cm/s TAPSE (M-mode): 0.9 cm LEFT ATRIUM             Index       RIGHT ATRIUM           Index LA diam:  5.00 cm 2.44 cm/m  RA Area:     21.80 cm LA Vol (A2C):   84.2 ml 41.16 ml/m RA Volume:   65.90 ml  32.21 ml/m LA Vol (A4C):   69.8 ml 34.12 ml/m LA Biplane Vol: 80.1 ml 39.15 ml/m  AORTIC VALVE AV Area (Vmax):    1.10 cm AV Area (Vmean):   0.90 cm AV Area (VTI):     1.16 cm AV Vmax:           134.00 cm/s AV Vmean:          97.950 cm/s AV VTI:            0.194 m AV Peak Grad:      7.2 mmHg AV  Mean Grad:      4.5 mmHg LVOT Vmax:         46.80 cm/s LVOT Vmean:        28.200 cm/s LVOT VTI:          0.072 m LVOT/AV VTI ratio: 0.37  AORTA Ao Root diam: 2.60 cm MITRAL VALVE MV Area (PHT): 4.17 cm            SHUNTS MV Decel Time: 182 msec            Systemic VTI:  0.07 m MV E velocity: 39.75 cm/s 103 cm/s Systemic Diam: 2.00 cm Loralie Champagne MD Electronically signed by Loralie Champagne MD Signature Date/Time: 11/22/2019/2:31:25 PM    Final    DG HIP PORT UNILAT WITH PELVIS 1V LEFT  Result Date: 11/24/2019 CLINICAL DATA:  Postop hip arthroplasty. EXAM: DG HIP (WITH OR WITHOUT PELVIS) 1V PORT LEFT COMPARISON:  11/22/2019. FINDINGS: Left hip arthroplasty. Femoral stem appears well seated. Surgical skin staples are in place. Subcutaneous air has decreased somewhat in the interval. Remainder of the visualized pelvis is unremarkable. IMPRESSION: Left hip arthroplasty with expected postoperative findings. Electronically Signed   By: Lorin Picket M.D.   On: 11/24/2019 11:10   DG HIP OPERATIVE UNILAT W OR W/O PELVIS LEFT  Result Date: 11/22/2019 CLINICAL DATA:  86 year old male status post left hip arthroplasty. EXAM: OPERATIVE left HIP (WITH PELVIS IF PERFORMED) 1 VIEWS TECHNIQUE: Fluoroscopic spot image(s) were submitted for interpretation post-operatively. COMPARISON:  Left hip radiograph dated 11/21/2019. FINDINGS: Spot fluoroscopic intraoperative images provided. The total fluoroscopic time is 6 seconds. There is a left hip arthroplasty. No immediate complication. IMPRESSION: Left hip arthroplasty. Electronically Signed   By: Anner Crete M.D.   On: 11/22/2019 17:00   DG Hip Unilat W or Wo Pelvis 2-3 Views Left  Result Date: 11/21/2019 CLINICAL DATA:  86 year old who fell and complains of LEFT hip and LEFT UPPER leg pain. Initial encounter. EXAM: DG HIP (WITH OR WITHOUT PELVIS) 2-3V LEFT COMPARISON:  None. FINDINGS: Acute subcapital or basicervical LEFT femoral neck fracture. LEFT hip joint  anatomically aligned with well-preserved joint space for age. Bone mineral density well preserved for age. Included AP pelvis demonstrates no fractures elsewhere. Well preserved joint space in the contralateral RIGHT hip. Sacroiliac joints anatomically aligned without diastasis, demonstrating only mild degenerative changes. Symphysis pubis anatomically aligned without diastasis or degenerative changes. IMPRESSION: Acute subcapital or basicervical LEFT femoral neck fracture. Electronically Signed   By: Evangeline Dakin M.D.   On: 11/21/2019 12:28   DG Femur Min 2 Views Left  Result Date: 11/21/2019 CLINICAL DATA:  86 year old who fell and complains of LEFT hip and LEFT UPPER leg pain. Initial encounter. EXAM: LEFT FEMUR 2 VIEWS COMPARISON:  None.  FINDINGS: Subcapital/basicervical LEFT femoral neck fracture as detailed on the report of the concurrent LEFT hip images. No fractures elsewhere involving the femur. Well-preserved bone mineral density. Femoropopliteal atherosclerosis. IMPRESSION: Subcapital/basicervical LEFT femoral neck fracture as detailed on the report of the concurrent LEFT hip images. No fractures elsewhere involving the femur. Electronically Signed   By: Hulan Saas M.D.   On: 11/21/2019 12:32      LAB RESULTS: Basic Metabolic Panel: Recent Labs  Lab 11/24/19 0445 11/25/19 0625  NA 144 145  K 3.6 3.8  CL 114* 110  CO2 25 26  GLUCOSE 124* 135*  BUN 34* 39*  CREATININE 1.37* 1.23  CALCIUM 9.1 9.2   Liver Function Tests: No results for input(s): AST, ALT, ALKPHOS, BILITOT, PROT, ALBUMIN in the last 168 hours. No results for input(s): LIPASE, AMYLASE in the last 168 hours. No results for input(s): AMMONIA in the last 168 hours. CBC: Recent Labs  Lab 11/21/19 1123 11/22/19 0536 11/24/19 0445 11/24/19 0445 11/25/19 0625  WBC 11.1*   < > 10.3  --  9.5  NEUTROABS 9.6*  --   --   --   --   HGB 15.3   < > 12.1*  --  12.2*  HCT 47.4   < > 37.3*  --  37.8*  MCV 94.0   <  > 96.1   < > 96.7  PLT 190   < > 140*  --  157   < > = values in this interval not displayed.   Cardiac Enzymes: Recent Labs  Lab 11/21/19 1123  CKTOTAL 49   BNP: Invalid input(s): POCBNP CBG: No results for input(s): GLUCAP in the last 168 hours.     Disposition and Follow-up: Discharge Instructions    Diet - low sodium heart healthy   Complete by: As directed    Increase activity slowly   Complete by: As directed        DISPOSITION: Home with home health PT OT   DISCHARGE FOLLOW-UP Follow-up Information    Kathryne Hitch, MD. Schedule an appointment as soon as possible for a visit in 2 week(s).   Specialty: Orthopedic Surgery Contact information: [address] [place] [address] [phone]        Rollene Rotunda, MD. Schedule an appointment as soon as possible for a visit in 4 week(s).   Specialty: Cardiology Contact information: [address] [place] [address] [phone]            Time coordinating discharge:  35 minutes  Signed:   Thad Ranger M.D. [facility] 11/26/2019, 10:36 AM

## 2019-11-26 NOTE — Progress Notes (Signed)
[facility])  Referral received for palliative services in the home once discharged.  Spoke with dtr [name], explained services and provided information.  ACC will arrange a home visit in the near future.  Wallis Bamberg RN, BSN, CCRN [facility] Liaison (in [place]) [phone]

## 2019-11-26 NOTE — TOC Transition Note (Signed)
Transition of Care [facility]) - CM/SW Discharge Note   Patient Details  Name: Frank Gates. MRN: [id number] Date of Birth: 04-14-1926  [facility]) CM/SW Contact:  Ida Rogue, LCSW Phone Number: 11/26/2019, 11:26 AM   Clinical Narrative:  Patient to d/c today; returning home.  Family alerted.  DME delivered.  PTAR arranged.  TOC sign off.     Final next level of care: Home w Home Health Services Barriers to Discharge: No Barriers Identified   Patient Goals and CMS Choice Patient states their goals for this hospitalization and ongoing recovery are:: Dementia   Choice offered to / list presented to : Patient, Adult Children  Discharge Placement                       Discharge Plan and Services   Discharge Planning Services: CM Consult Post Acute Care Choice: Home Health, Durable Medical Equipment                               Social Determinants of Health (SDOH) Interventions     Readmission Risk Interventions No flowsheet data found.

## 2019-11-26 NOTE — Progress Notes (Signed)
During discharge, son stated he had not received any education regarding use of immobilizer. Physical therapy was consulted. Spoke with daughter, [name]. Advised her that patient's immobilizer should not be removed except for cleaning and/or circulation check of left lower extremity. Otherwise immobilizer should stay in place continuously until Dr. Magnus Ivan states otherwise. Daughter verbalized understanding.

## 2019-11-26 NOTE — Progress Notes (Signed)
Daily Progress Note   Patient Name: Frank Gates.       Date: 11/26/2019 DOB: 08/09/1926  Age: 86 y.o. MRN#: [id number] Attending Physician: Cathren Harsh, MD Primary Care Physician: Merlene Laughter, MD Admit Date: 11/21/2019  Reason for Consultation/Follow-up: Establishing goals of care  Subjective: I saw and examined Frank Gates this AM.  He is more awake and interactive, but he does not really attempt to speak to me today.  Discussed with his daughter, [name], and she would like him to be followed by outpatient palliative care on discharge.  She has looked at programs and requested to get him set up with [facility] outpatient palliative.  Length of Stay: 5  Current Medications: Scheduled Meds:  . aspirin EC  325 mg Oral Q breakfast  . cephALEXin  500 mg Oral Q12H  . docusate sodium  100 mg Oral BID  . furosemide  20 mg Oral Daily  . senna  1 tablet Oral BID  . sodium chloride flush  3 mL Intravenous Q12H    Continuous Infusions:   PRN Meds: acetaminophen, haloperidol lactate, menthol-cetylpyridinium **OR** phenol, morphine injection, ondansetron **OR** ondansetron (ZOFRAN) IV, sodium phosphate, sorbitol  Physical Exam         General: Alert, awake, frail and elderly.  HEENT: No bruits, no goiter, no JVD Heart: S1, S2 Lungs: Fair air movement, clear Abdomen: Soft, nontender, nondistended, positive bowel sounds.  Ext: No significant edema Skin: Warm and dry  Vital Signs: BP (!) 117/92 (BP Location: Left Arm)   Pulse 100   Temp 97.7 F (36.5 C) (Oral)   Resp 20   Ht 6' 0.01" (1.829 m)   Wt 82.5 kg   SpO2 100%   BMI 24.66 kg/m  SpO2: SpO2: 100 % O2 Device: O2 Device: Nasal Cannula O2 Flow Rate: O2 Flow Rate (L/min): 2 L/min  Intake/output summary:    Intake/Output Summary (Last 24 hours) at 11/26/2019 1023 Last data filed at 11/25/2019 1800 Gross per 24 hour  Intake 60 ml  Output 800 ml  Net -740 ml   LBM: Last BM Date: (Unable to assess) Baseline Weight: Weight: 82.5 kg Most recent weight: Weight: 82.5 kg       Palliative Assessment/Data:    Flowsheet Rows     Most Recent Value  Intake Tab  Referral Department  Hospitalist  Unit at Time of Referral  Med/Surg Unit  Palliative Care Primary Diagnosis  Trauma  Date Notified  11/23/19  Palliative Care Type  New Palliative care  Reason for referral  Clarify Goals of Care  Date of Admission  11/21/19  Date first seen by Palliative Care  11/25/19  # of days Palliative referral response time  2 Day(s)  # of days IP prior to Palliative referral  2  Clinical Assessment  Palliative Performance Scale Score  30%  Psychosocial & Spiritual Assessment  Palliative Care Outcomes  Patient/Family meeting held?  Yes  Who was at the meeting?  Daughter via phone      Patient Active Problem List   Diagnosis Date Noted  . Hip fracture ([place]) 11/21/2019  . PVC's (premature ventricular contractions) 04/26/2014  . Hypercholesterolemia 06/21/2013  . CAD (coronary artery disease) 05/29/2011  . HTN (hypertension) 05/29/2011  . Memory deficit 05/29/2011  . Abnormal CXR 05/29/2011  . Aortic stenosis 05/29/2011    Palliative Care Assessment & Plan   Patient Profile: [name] is a 86 year old male with past medical history of Alzheimer's dementia, hypertension, hyperlipidemia, CAD status post CABG in 2001, ischemic heart disease, congestive heart failure, mild to moderate aortic stenosis who presented after unwitnessed fall with hip fracture now status post left hip hemiarthroplasty.  It was discovered this admission that EF is less than 20%.  Palliative consulted for goals of care.  Recommendations/Plan:  Discussed again with daughter, Frank Gates, today.  We discussed plan for discharge with  outpatient palliative follow-up once he transitions home.  She also has reviewed a MOST form and will discuss this with OP palliative at time of follow-up as well.  Goals of Care and Additional Recommendations:  Limitations on Scope of Treatment: Avoid Hospitalization  Code Status:    Code Status Orders  (From admission, onward)         Start     Ordered   11/21/19 1758  Do not attempt resuscitation (DNR)  Continuous    Question Answer Comment  In the event of cardiac or respiratory ARREST Do not call a "code blue"   In the event of cardiac or respiratory ARREST Do not perform Intubation, CPR, defibrillation or ACLS   In the event of cardiac or respiratory ARREST Use medication by any route, position, wound care, and other measures to relive pain and suffering. May use oxygen, suction and manual treatment of airway obstruction as needed for comfort.      11/21/19 1757        Code Status History    This patient has a current code status but no historical code status.   Advance Care Planning Activity    Advance Directive Documentation     Most Recent Value  Type of Advance Directive  Healthcare Power of Attorney, Living will  Pre-existing out of facility DNR order (yellow form or pink MOST form)  -  "MOST" Form in Place?  -       Prognosis:   Unable to determine as will depend on clinical course once he transitions home. Recommend outpatient palliative care to follow on discharge to assist in decision making based on his clinical course.  I called and discussed with [facility] liaison per daughter request.  Discharge Planning:  Home with Palliative Services and home health  Care plan was discussed with daughter  Thank you for allowing the Palliative Medicine Team to assist in the care of this patient.  Total Time 20 Prolonged Time Billed No      Greater than 50%  of this time was spent counseling and coordinating care related to the above assessment and plan.   Romie Minus, MD  Please contact Palliative Medicine Team phone at [phone] for questions and concerns.

## 2019-11-26 NOTE — Progress Notes (Addendum)
Progress Note  Patient Name: Frank Gates. Date of Encounter: 11/26/2019  Primary Cardiologist: Remote-Dr. Brackbill(08/2015)  Subjective   More awake today. Remains confused. No complaints. Denies pain.   Inpatient Medications    Scheduled Meds: . aspirin EC  325 mg Oral Q breakfast  . cephALEXin  500 mg Oral Q12H  . docusate sodium  100 mg Oral BID  . furosemide  20 mg Oral Daily  . senna  1 tablet Oral BID  . sodium chloride flush  3 mL Intravenous Q12H   Continuous Infusions: . lactated ringers 50 mL/hr at 11/22/19 1333   PRN Meds: acetaminophen, haloperidol lactate, menthol-cetylpyridinium **OR** phenol, morphine injection, ondansetron **OR** ondansetron (ZOFRAN) IV, sodium phosphate, sorbitol   Vital Signs    Vitals:   11/25/19 0443 11/25/19 1343 11/25/19 2018 11/26/19 0612  BP: 106/60 104/80 103/68 (!) 117/92  Pulse: 83 (!) 102 (!) 101 100  Resp: 18 14 18 20   Temp: 98.9 F (37.2 C) 98 F (36.7 C) 98.1 F (36.7 C) 97.7 F (36.5 C)  TempSrc: Oral Oral Oral Oral  SpO2: 99% 100% 100% 100%  Weight:      Height:        Intake/Output Summary (Last 24 hours) at 11/26/2019 0734 Last data filed at 11/25/2019 1800 Gross per 24 hour  Intake 60 ml  Output 800 ml  Net -740 ml   Filed Weights   11/23/19 1112  Weight: 82.5 kg    Physical Exam   General: Elderly, frail, NAD Neck: Negative for carotid bruits. No JVD Lungs:Clear to ausculation bilaterally. No wheezes, rales, or rhonchi. Breathing is unlabored. Cardiovascular: RRR with S1 S2. No murmurs.  Abdomen: Soft, non-tender, non-distended. No obvious abdominal masses. Extremities: No edema. DP pulses 1+ bilaterally Neuro: Alert and disoriented. No focal deficits. MAE spontaneously. Psych: Does not respond to questions appropriately with normal affect.    Labs    Chemistry Recent Labs  Lab 11/23/19 [phone] 11/24/19 0445 11/25/19 0625  NA 142 144 145  K 4.3 3.6 3.8  CL 113* 114* 110  CO2  25 25 26   GLUCOSE 115* 124* 135*  BUN 32* 34* 39*  CREATININE 1.41* 1.37* 1.23  CALCIUM 9.0 9.1 9.2  GFRNONAA 43* 45* 51*  GFRAA 50* 52* 59*  ANIONGAP 4* 5 9     Hematology Recent Labs  Lab 11/23/19 0634 11/24/19 0445 11/25/19 0625  WBC 8.4 10.3 9.5  RBC 4.48 3.88* 3.91*  HGB 13.6 12.1* 12.2*  HCT 43.4 37.3* 37.8*  MCV 96.9 96.1 96.7  MCH 30.4 31.2 31.2  MCHC 31.3 32.4 32.3  RDW 13.1 13.1 12.9  PLT 141* 140* 157    Cardiac EnzymesNo results for input(s): TROPONINI in the last 168 hours. No results for input(s): TROPIPOC in the last 168 hours.   BNPNo results for input(s): BNP, PROBNP in the last 168 hours.   DDimer No results for input(s): DDIMER in the last 168 hours.   Radiology    CT HIP LEFT WO CONTRAST  Result Date: 11/24/2019 CLINICAL DATA:  Left hip fracture EXAM: CT OF THE LEFT HIP WITHOUT CONTRAST TECHNIQUE: Multidetector CT imaging of the left hip was performed according to the standard protocol. Multiplanar CT image reconstructions were also generated. COMPARISON:  Multiple priors most recent same day radiograph FINDINGS: Bones/Joint/Cartilage The patient is status post left total hip arthroplasty. No periprosthetic lucency or acute fracture is identified. The femoral head prosthesis is well seated within the acetabular component. There  is slight anterior rotation of the femoral shaft. A moderate hip joint effusion is seen. Ligaments Suboptimally assessed by CT. Muscles and Tendons There is diffuse subcutaneous emphysema seen surrounding the hip extending into the adductor, anterior, gluteal musculature. There is limited visualization of the tendons, however no displaced tendon rupture seen. Soft tissues Soft tissue swelling with subcutaneous emphysema seen overlying the lateral hip. The sacroiliac joint is intact. There is diffuse osteopenia. The visualized deep pelvis is unremarkable. Scattered dense vascular calcifications are noted. IMPRESSION: Status post left  total hip arthroplasty without complication. Electronically Signed   By: Jonna Clark M.D.   On: 11/24/2019 19:09   DG HIP PORT UNILAT WITH PELVIS 1V LEFT  Result Date: 11/24/2019 CLINICAL DATA:  Postop hip arthroplasty. EXAM: DG HIP (WITH OR WITHOUT PELVIS) 1V PORT LEFT COMPARISON:  11/22/2019. FINDINGS: Left hip arthroplasty. Femoral stem appears well seated. Surgical skin staples are in place. Subcutaneous air has decreased somewhat in the interval. Remainder of the visualized pelvis is unremarkable. IMPRESSION: Left hip arthroplasty with expected postoperative findings. Electronically Signed   By: Leanna Battles M.D.   On: 11/24/2019 11:10   Telemetry    2.12.21 NSR - Personally Reviewed  ECG    No new tracing as of 11/26/19 - Personally Reviewed  Cardiac Studies   Echocardiogram 11/22/2019:  1. Left ventricular ejection fraction, by visual estimation, is <20%. The  left ventricle has severely decreased function. There is no left  ventricular hypertrophy. Indeterminant diastolic function.  2. Definity contrast agent was given IV to delineate the left ventricular  endocardial borders.  3. The left ventricle demonstrates global hypokinesis.  4. Global right ventricle has moderately reduced systolic function.The  right ventricular size is normal. No increase in right ventricular wall  thickness.  5. Left atrial size was mild-moderately dilated.  6. Right atrial size was mildly dilated.  7. Mild mitral annular calcification.  8. The mitral valve is normal in structure. Mild mitral valve  regurgitation. No evidence of mitral stenosis.  9. The tricuspid valve is normal in structure. Tricuspid valve  regurgitation is trivial.  10. The aortic valve is tricuspid. Aortic valve regurgitation is mild. At  lease mild aortic valve stenosis, mean gradient only measured at 5 mmHg  but suspect this is due to low EF.  11. TR signal is inadequate for assessing pulmonary artery systolic    pressure.   Patient Profile    86 y.o. male with a hx of Alzheimer's dementia, CADs/pCABG 2001, essential hypertension, mild to moderate aortic stenosis per echocardiogram 7/2008andhyperlipidemiawho is being seen today for the evaluation of new severely reduced LV function at 20%at the request ofDr.Nettey.  Assessment & Plan    1.Severe systolic cardiomyopathywith known CAD s/pCABG 2001: -Last echocardiogram from 2008 on file in epicwith mildly reduced LV function. Given history of cardiovascular disease, echocardiogram performed prior to left hip surgical repair. Unfortunately, LV found to be severely reduced at less than 20% with global hypokinesis. -In the absence of CV symptoms along with advanced age and recent left hip fracture limiting antiplatelet therapies would opt for medical management for systolic dysfunction at this time. -Currently on ASA 325 mg p.o. daily given recent hip fracture repair -Once okay with orthopedic team, will reduce to ASA 81 mg p.o. daily -Creatinine improve from 1.37 to 1.23 yesterday.Ideally would like to add ACEI/ARB>>BP continues to be a little too low to initiate at this time   -Appears euvolemic on exam -Continue IV Lasix >>>creatinine improving  -Palliative  care medicine consulted with plans for home health palliation, DNR/DNI  2. Left hip fracture with repair: -Patient initially presented to [facility] after sustaining a mechanical, unwitnessed fall at his home where he lives with his son. Imaging on ED presentation revealed left femur fracture. Echocardiogram performed given his history of CAD which revealed a severely reduced LVEF less than 20%. Given no CV symptoms, patient was taken to the OR for surgical repair without complication. -Management per orthopedics, primary team  3. Hypertension: -Low,117/92>103/68>104/80 -Not currently on antihypertensive medications -Would benefit from the addition of ACE/ARB  however BP remain a little soft at this time   4. Mild aortic stenosis: -Previous echocardiogram with mild to moderate aortic stenosis however echocardiogram this hospital admission with evidence of mild AS with a mean gradient of 5 mmHg -Unfortunately shows severe LV dysfunction with an LVEF of less than 20% and global hypokinesis -Previous echocardiogram performed in 2008,scanned images in epic -No symptoms at this time  5. HLD: -LDL,94 from 08/28/2015 -Not currently on statin therapy  6. CKD stage II: -Baseline creatinine appears to be in the 1.2-1.4 range -Creatinine today, 1.23 on 11/25/19, down from 1.37 on 11/24/19>>no AM labs today  -Could consider low-doseACE/ARBfor guideline directed therapy however would need to be mindful of renal dysfunction   Signed, Kathyrn Drown NP-[facility] [place] Pager: [phone] 11/26/2019, 7:34 AM     For questions or updates, please contact   Please consult [email or web address] for contact info under Cardiology/STEMI.  History and all data above reviewed.  Patient examined.  I agree with the findings as above.  He is about to go home.  No pain.  No distress and slightly more awake.  O2 sats 97% on RA. The patient exam reveals COR:RRR  ,  Lungs: Clear  ,  Abd: Positive bowel sounds, no rebound no guarding, Ext No edema.  All available labs, radiology testing, previous records reviewed. Agree with documented assessment and plan. Home for follow up with Lajean Manes, MD.  Low dose diuretic.  Will defer to PCP med titration of BP allows in the future.  I have educated his son extensively about fluid and salt.   Jeneen Rinks Heavenlee Maiorana  11:52 AM  11/26/2019

## 2019-11-27 DIAGNOSIS — I2510 Atherosclerotic heart disease of native coronary artery without angina pectoris: Secondary | ICD-10-CM | POA: Diagnosis not present

## 2019-11-27 DIAGNOSIS — I083 Combined rheumatic disorders of mitral, aortic and tricuspid valves: Secondary | ICD-10-CM | POA: Diagnosis not present

## 2019-11-27 DIAGNOSIS — Z96642 Presence of left artificial hip joint: Secondary | ICD-10-CM | POA: Diagnosis not present

## 2019-11-27 DIAGNOSIS — I429 Cardiomyopathy, unspecified: Secondary | ICD-10-CM | POA: Diagnosis not present

## 2019-11-27 DIAGNOSIS — I0981 Rheumatic heart failure: Secondary | ICD-10-CM | POA: Diagnosis not present

## 2019-11-27 DIAGNOSIS — N1832 Chronic kidney disease, stage 3b: Secondary | ICD-10-CM | POA: Diagnosis not present

## 2019-11-27 DIAGNOSIS — E7800 Pure hypercholesterolemia, unspecified: Secondary | ICD-10-CM | POA: Diagnosis not present

## 2019-11-27 DIAGNOSIS — I130 Hypertensive heart and chronic kidney disease with heart failure and stage 1 through stage 4 chronic kidney disease, or unspecified chronic kidney disease: Secondary | ICD-10-CM | POA: Diagnosis not present

## 2019-11-27 DIAGNOSIS — Z7982 Long term (current) use of aspirin: Secondary | ICD-10-CM | POA: Diagnosis not present

## 2019-11-27 DIAGNOSIS — G309 Alzheimer's disease, unspecified: Secondary | ICD-10-CM | POA: Diagnosis not present

## 2019-11-27 DIAGNOSIS — B9620 Unspecified Escherichia coli [E. coli] as the cause of diseases classified elsewhere: Secondary | ICD-10-CM | POA: Diagnosis not present

## 2019-11-27 DIAGNOSIS — Z951 Presence of aortocoronary bypass graft: Secondary | ICD-10-CM | POA: Diagnosis not present

## 2019-11-27 DIAGNOSIS — N390 Urinary tract infection, site not specified: Secondary | ICD-10-CM | POA: Diagnosis not present

## 2019-11-27 DIAGNOSIS — J9621 Acute and chronic respiratory failure with hypoxia: Secondary | ICD-10-CM | POA: Diagnosis not present

## 2019-11-27 DIAGNOSIS — I5021 Acute systolic (congestive) heart failure: Secondary | ICD-10-CM | POA: Diagnosis not present

## 2019-11-27 DIAGNOSIS — L89322 Pressure ulcer of left buttock, stage 2: Secondary | ICD-10-CM | POA: Diagnosis not present

## 2019-11-27 DIAGNOSIS — F0280 Dementia in other diseases classified elsewhere without behavioral disturbance: Secondary | ICD-10-CM | POA: Diagnosis not present

## 2019-11-27 DIAGNOSIS — Z9181 History of falling: Secondary | ICD-10-CM | POA: Diagnosis not present

## 2019-11-27 DIAGNOSIS — S72002D Fracture of unspecified part of neck of left femur, subsequent encounter for closed fracture with routine healing: Secondary | ICD-10-CM | POA: Diagnosis not present

## 2019-11-29 ENCOUNTER — Telehealth: Payer: Self-pay | Admitting: Orthopaedic Surgery

## 2019-11-29 DIAGNOSIS — I5021 Acute systolic (congestive) heart failure: Secondary | ICD-10-CM | POA: Diagnosis not present

## 2019-11-29 DIAGNOSIS — N1832 Chronic kidney disease, stage 3b: Secondary | ICD-10-CM | POA: Diagnosis not present

## 2019-11-29 DIAGNOSIS — I5022 Chronic systolic (congestive) heart failure: Secondary | ICD-10-CM | POA: Diagnosis not present

## 2019-11-29 DIAGNOSIS — I10 Essential (primary) hypertension: Secondary | ICD-10-CM | POA: Diagnosis not present

## 2019-11-29 DIAGNOSIS — I0981 Rheumatic heart failure: Secondary | ICD-10-CM | POA: Diagnosis not present

## 2019-11-29 DIAGNOSIS — I2510 Atherosclerotic heart disease of native coronary artery without angina pectoris: Secondary | ICD-10-CM | POA: Diagnosis not present

## 2019-11-29 DIAGNOSIS — I130 Hypertensive heart and chronic kidney disease with heart failure and stage 1 through stage 4 chronic kidney disease, or unspecified chronic kidney disease: Secondary | ICD-10-CM | POA: Diagnosis not present

## 2019-11-29 DIAGNOSIS — R451 Restlessness and agitation: Secondary | ICD-10-CM | POA: Diagnosis not present

## 2019-11-29 DIAGNOSIS — G309 Alzheimer's disease, unspecified: Secondary | ICD-10-CM | POA: Diagnosis not present

## 2019-11-29 DIAGNOSIS — S72002D Fracture of unspecified part of neck of left femur, subsequent encounter for closed fracture with routine healing: Secondary | ICD-10-CM | POA: Diagnosis not present

## 2019-11-29 NOTE — Telephone Encounter (Signed)
[facility]PT called and stated that needed verbal order for 3x for 3 weeks 2x weeks for 5 weeks

## 2019-11-29 NOTE — Telephone Encounter (Signed)
Verbal order given  

## 2019-11-30 DIAGNOSIS — N1832 Chronic kidney disease, stage 3b: Secondary | ICD-10-CM | POA: Diagnosis not present

## 2019-11-30 DIAGNOSIS — I0981 Rheumatic heart failure: Secondary | ICD-10-CM | POA: Diagnosis not present

## 2019-11-30 DIAGNOSIS — I5021 Acute systolic (congestive) heart failure: Secondary | ICD-10-CM | POA: Diagnosis not present

## 2019-11-30 DIAGNOSIS — I2510 Atherosclerotic heart disease of native coronary artery without angina pectoris: Secondary | ICD-10-CM | POA: Diagnosis not present

## 2019-11-30 DIAGNOSIS — I130 Hypertensive heart and chronic kidney disease with heart failure and stage 1 through stage 4 chronic kidney disease, or unspecified chronic kidney disease: Secondary | ICD-10-CM | POA: Diagnosis not present

## 2019-11-30 DIAGNOSIS — S72002D Fracture of unspecified part of neck of left femur, subsequent encounter for closed fracture with routine healing: Secondary | ICD-10-CM | POA: Diagnosis not present

## 2019-12-01 DIAGNOSIS — I5021 Acute systolic (congestive) heart failure: Secondary | ICD-10-CM | POA: Diagnosis not present

## 2019-12-01 DIAGNOSIS — I2510 Atherosclerotic heart disease of native coronary artery without angina pectoris: Secondary | ICD-10-CM | POA: Diagnosis not present

## 2019-12-01 DIAGNOSIS — I130 Hypertensive heart and chronic kidney disease with heart failure and stage 1 through stage 4 chronic kidney disease, or unspecified chronic kidney disease: Secondary | ICD-10-CM | POA: Diagnosis not present

## 2019-12-01 DIAGNOSIS — S72002D Fracture of unspecified part of neck of left femur, subsequent encounter for closed fracture with routine healing: Secondary | ICD-10-CM | POA: Diagnosis not present

## 2019-12-01 DIAGNOSIS — I0981 Rheumatic heart failure: Secondary | ICD-10-CM | POA: Diagnosis not present

## 2019-12-01 DIAGNOSIS — N1832 Chronic kidney disease, stage 3b: Secondary | ICD-10-CM | POA: Diagnosis not present

## 2019-12-03 ENCOUNTER — Other Ambulatory Visit: Payer: Medicare Other | Admitting: Licensed Clinical Social Worker

## 2019-12-03 ENCOUNTER — Other Ambulatory Visit: Payer: Self-pay

## 2019-12-03 DIAGNOSIS — I0981 Rheumatic heart failure: Secondary | ICD-10-CM | POA: Diagnosis not present

## 2019-12-03 DIAGNOSIS — N1832 Chronic kidney disease, stage 3b: Secondary | ICD-10-CM | POA: Diagnosis not present

## 2019-12-03 DIAGNOSIS — I130 Hypertensive heart and chronic kidney disease with heart failure and stage 1 through stage 4 chronic kidney disease, or unspecified chronic kidney disease: Secondary | ICD-10-CM | POA: Diagnosis not present

## 2019-12-03 DIAGNOSIS — Z515 Encounter for palliative care: Secondary | ICD-10-CM

## 2019-12-03 DIAGNOSIS — I2510 Atherosclerotic heart disease of native coronary artery without angina pectoris: Secondary | ICD-10-CM | POA: Diagnosis not present

## 2019-12-03 DIAGNOSIS — I5021 Acute systolic (congestive) heart failure: Secondary | ICD-10-CM | POA: Diagnosis not present

## 2019-12-03 DIAGNOSIS — S72002D Fracture of unspecified part of neck of left femur, subsequent encounter for closed fracture with routine healing: Secondary | ICD-10-CM | POA: Diagnosis not present

## 2019-12-06 ENCOUNTER — Other Ambulatory Visit: Payer: Medicare Other | Admitting: *Deleted

## 2019-12-06 ENCOUNTER — Other Ambulatory Visit: Payer: TRICARE For Life (TFL) | Admitting: Licensed Clinical Social Worker

## 2019-12-06 ENCOUNTER — Other Ambulatory Visit: Payer: Self-pay

## 2019-12-06 DIAGNOSIS — N1832 Chronic kidney disease, stage 3b: Secondary | ICD-10-CM | POA: Diagnosis not present

## 2019-12-06 DIAGNOSIS — I5021 Acute systolic (congestive) heart failure: Secondary | ICD-10-CM | POA: Diagnosis not present

## 2019-12-06 DIAGNOSIS — I130 Hypertensive heart and chronic kidney disease with heart failure and stage 1 through stage 4 chronic kidney disease, or unspecified chronic kidney disease: Secondary | ICD-10-CM | POA: Diagnosis not present

## 2019-12-06 DIAGNOSIS — I2510 Atherosclerotic heart disease of native coronary artery without angina pectoris: Secondary | ICD-10-CM | POA: Diagnosis not present

## 2019-12-06 DIAGNOSIS — S72002D Fracture of unspecified part of neck of left femur, subsequent encounter for closed fracture with routine healing: Secondary | ICD-10-CM | POA: Diagnosis not present

## 2019-12-06 DIAGNOSIS — Z515 Encounter for palliative care: Secondary | ICD-10-CM

## 2019-12-06 DIAGNOSIS — I0981 Rheumatic heart failure: Secondary | ICD-10-CM | POA: Diagnosis not present

## 2019-12-06 NOTE — Progress Notes (Signed)
COMMUNITY PALLIATIVE CARE SW NOTE  PATIENT NAME: Frank Gates. DOB: 1926/10/04 MRN: [id number]  PRIMARY CARE PROVIDER: Lajean Manes, MD  RESPONSIBLE PARTY:  Acct ID - Guarantor Home Phone Work Phone Relationship Acct Type  [id number] - [name],[name]  Self P/F     [address], [place], [place] [address]     PLAN OF CARE and INTERVENTIONS:             1. GOALS OF CARE/ ADVANCE CARE PLANNING:  Family's goal is for patient to receive rehab in a SNF as soon as possible.  Patient is a DNR. 2. SOCIAL/EMOTIONAL/SPIRITUAL ASSESSMENT/ INTERVENTIONS:  SW and Palliative Care RN, Daryl Eastern, met with patient and his son, Frank Gates.  Their roommate was present for a portion of the visit, and the [place] caregiver.  Patient was in his recliner.  He was alert and only answered a few questions with 1-3 words.  He would also fidget with his bedding items.  Son was able to provide patient with some items that helped distract him.  Frank Gates was aware that the [place] was attempting to obtain the [facility] from Dr. Felipa Eth.  This SW left a message with MD's RN, Marye Round, to obtain additional information and to inquire about a hospital bed that was supposed to be ordered.  Son appears to be taking good care of patient, but is exhausted from lack of sleep.  SW provided active listening, and supportive counseling. 3. PATIENT/CAREGIVER EDUCATION/ COPING:  Provided education to patient's son regarding Palliative Care and left the folder.  Family copes by problem-solving. 4. PERSONAL EMERGENCY PLAN:  Family will try to redirect patient if he becomes restless. 5. COMMUNITY RESOURCES COORDINATION/ HEALTH CARE NAVIGATION:   [facility] provides PT, OT, RN and CNA.  [facility] provides sitter services. 6. FINANCIAL/LEGAL CONCERNS/INTERVENTIONS:  None.  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     SOCIAL HX:  Social History   Tobacco Use  . Smoking  status: Never Smoker  . Smokeless tobacco: Never Used  Substance Use Topics  . Alcohol use: No    CODE STATUS:  DNR ADVANCED DIRECTIVES: N MOST FORM COMPLETE:  N HOSPICE EDUCATION PROVIDED:  n PPS:  Patient's appetite has declined.  He is nonambulatory. Duration of visit and documentation:  60 minutes.      Creola Corn Mahin Guardia, LCSW

## 2019-12-06 NOTE — Progress Notes (Signed)
COMMUNITY PALLIATIVE CARE SW NOTE  PATIENT NAME: Frank Gates. DOB: Apr 05, 1926 MRN: [id number]  PRIMARY CARE PROVIDER: Merlene Laughter, MD  RESPONSIBLE PARTY:  Acct ID - Guarantor Home Phone Work Phone Relationship Acct Type  [id number] Lianne Cure* [phone]  Self P/F     [address], [place], [place] [address]   Due to the COVID-19 crisis, this virtual check-in visit was done via telephone from my office and it was initiated and consent given by this patient and or family.   PLAN OF CARE and INTERVENTIONS:             1. GOALS OF CARE/ ADVANCE CARE PLANNING:  Goal is for patient to be placed in a nursing home.  He is a DNR. 2. SOCIAL/EMOTIONAL/SPIRITUAL ASSESSMENT/ INTERVENTIONS:  SW conducted a Biomedical engineer visit with patient's daughter, [name].  She is currently attempting to place patient in a nursing home.  She has requested a FL2 from Dr. Pete Glatter.  Patient currently lives with his son and another male.  Patient's Alzheimers disease has increased his depressed mood and lack of mobility.  A home visit is scheduled for Monday at 10:30am. 3. PATIENT/CAREGIVER EDUCATION/ COPING:  Provided education regarding the Palliative Care program.  Daughter copes by problem-solving. 4. PERSONAL EMERGENCY PLAN:  Daughter will call patient's MD. 5. COMMUNITY RESOURCES COORDINATION/ HEALTH CARE NAVIGATION:  [facility] provides PT, OT and RN.  [facility] provides CNA services. 6. FINANCIAL/LEGAL CONCERNS/INTERVENTIONS:  None.     SOCIAL HX:  Social History   Tobacco Use  . Smoking status: Never Smoker  . Smokeless tobacco: Never Used  Substance Use Topics  . Alcohol use: No    CODE STATUS:  DNR ADVANCED DIRECTIVES: N MOST FORM COMPLETE:  N HOSPICE EDUCATION PROVIDED: Y PPS:  Patient is nonambulatory.  His appetite is normal. Duration of visit and documentation:  30 minutes.      Vella Kohler Brace Welte, LCSW

## 2019-12-07 DIAGNOSIS — L89626 Pressure-induced deep tissue damage of left heel: Secondary | ICD-10-CM | POA: Diagnosis not present

## 2019-12-07 DIAGNOSIS — S72032D Displaced midcervical fracture of left femur, subsequent encounter for closed fracture with routine healing: Secondary | ICD-10-CM | POA: Diagnosis not present

## 2019-12-07 DIAGNOSIS — M6281 Muscle weakness (generalized): Secondary | ICD-10-CM | POA: Diagnosis present

## 2019-12-07 DIAGNOSIS — I10 Essential (primary) hypertension: Secondary | ICD-10-CM | POA: Diagnosis not present

## 2019-12-07 DIAGNOSIS — N1832 Chronic kidney disease, stage 3b: Secondary | ICD-10-CM | POA: Diagnosis present

## 2019-12-07 DIAGNOSIS — N390 Urinary tract infection, site not specified: Secondary | ICD-10-CM | POA: Diagnosis present

## 2019-12-07 DIAGNOSIS — M6259 Muscle wasting and atrophy, not elsewhere classified, multiple sites: Secondary | ICD-10-CM | POA: Diagnosis present

## 2019-12-07 DIAGNOSIS — I5020 Unspecified systolic (congestive) heart failure: Secondary | ICD-10-CM | POA: Diagnosis not present

## 2019-12-07 DIAGNOSIS — Z741 Need for assistance with personal care: Secondary | ICD-10-CM | POA: Diagnosis present

## 2019-12-07 DIAGNOSIS — R5381 Other malaise: Secondary | ICD-10-CM | POA: Diagnosis not present

## 2019-12-07 DIAGNOSIS — S72002A Fracture of unspecified part of neck of left femur, initial encounter for closed fracture: Secondary | ICD-10-CM | POA: Diagnosis not present

## 2019-12-07 DIAGNOSIS — Z79899 Other long term (current) drug therapy: Secondary | ICD-10-CM | POA: Diagnosis not present

## 2019-12-07 DIAGNOSIS — G8929 Other chronic pain: Secondary | ICD-10-CM | POA: Diagnosis present

## 2019-12-07 DIAGNOSIS — R279 Unspecified lack of coordination: Secondary | ICD-10-CM | POA: Diagnosis present

## 2019-12-07 DIAGNOSIS — Z96642 Presence of left artificial hip joint: Secondary | ICD-10-CM | POA: Diagnosis not present

## 2019-12-07 DIAGNOSIS — R41841 Cognitive communication deficit: Secondary | ICD-10-CM | POA: Diagnosis present

## 2019-12-07 DIAGNOSIS — G309 Alzheimer's disease, unspecified: Secondary | ICD-10-CM | POA: Diagnosis present

## 2019-12-07 DIAGNOSIS — R1312 Dysphagia, oropharyngeal phase: Secondary | ICD-10-CM | POA: Diagnosis present

## 2019-12-07 DIAGNOSIS — R627 Adult failure to thrive: Secondary | ICD-10-CM | POA: Diagnosis present

## 2019-12-07 DIAGNOSIS — S7292XA Unspecified fracture of left femur, initial encounter for closed fracture: Secondary | ICD-10-CM | POA: Diagnosis not present

## 2019-12-07 DIAGNOSIS — Z8673 Personal history of transient ischemic attack (TIA), and cerebral infarction without residual deficits: Secondary | ICD-10-CM | POA: Diagnosis not present

## 2019-12-07 DIAGNOSIS — F0280 Dementia in other diseases classified elsewhere without behavioral disturbance: Secondary | ICD-10-CM | POA: Diagnosis not present

## 2019-12-07 DIAGNOSIS — I259 Chronic ischemic heart disease, unspecified: Secondary | ICD-10-CM | POA: Diagnosis present

## 2019-12-07 DIAGNOSIS — R400 Somnolence: Secondary | ICD-10-CM | POA: Diagnosis not present

## 2019-12-07 DIAGNOSIS — L89313 Pressure ulcer of right buttock, stage 3: Secondary | ICD-10-CM | POA: Diagnosis not present

## 2019-12-07 DIAGNOSIS — R2689 Other abnormalities of gait and mobility: Secondary | ICD-10-CM | POA: Diagnosis present

## 2019-12-07 DIAGNOSIS — I2510 Atherosclerotic heart disease of native coronary artery without angina pectoris: Secondary | ICD-10-CM | POA: Diagnosis present

## 2019-12-07 DIAGNOSIS — R296 Repeated falls: Secondary | ICD-10-CM | POA: Diagnosis not present

## 2019-12-07 DIAGNOSIS — R52 Pain, unspecified: Secondary | ICD-10-CM | POA: Diagnosis not present

## 2019-12-07 DIAGNOSIS — M21372 Foot drop, left foot: Secondary | ICD-10-CM | POA: Diagnosis present

## 2019-12-08 ENCOUNTER — Telehealth: Payer: Self-pay | Admitting: Orthopaedic Surgery

## 2019-12-08 DIAGNOSIS — L89313 Pressure ulcer of right buttock, stage 3: Secondary | ICD-10-CM | POA: Diagnosis not present

## 2019-12-08 DIAGNOSIS — M6281 Muscle weakness (generalized): Secondary | ICD-10-CM | POA: Diagnosis not present

## 2019-12-08 DIAGNOSIS — L89626 Pressure-induced deep tissue damage of left heel: Secondary | ICD-10-CM | POA: Diagnosis not present

## 2019-12-08 NOTE — Telephone Encounter (Signed)
Please advise 

## 2019-12-08 NOTE — Telephone Encounter (Signed)
Please call patient's daughter and talk to her about when she should bring patient in. He went into Rehabilitation and is in quarantine and has missed his 2 week f/u appt. Wants to know what she should do about his follow up appt.   Her number is [phone]

## 2019-12-08 NOTE — Telephone Encounter (Signed)
We do need to see him at some point since he is post op from a partial hip replacement. That is okay that they missed their last appointment. We do need to take staples out but we can do that either later this week or next week without any problems. He just needs follow-up.

## 2019-12-09 NOTE — Telephone Encounter (Signed)
FYI - I did called patients daughter and left a message to call back.

## 2019-12-10 ENCOUNTER — Telehealth: Payer: Self-pay | Admitting: Orthopaedic Surgery

## 2019-12-10 DIAGNOSIS — I5020 Unspecified systolic (congestive) heart failure: Secondary | ICD-10-CM | POA: Diagnosis not present

## 2019-12-10 DIAGNOSIS — S72002A Fracture of unspecified part of neck of left femur, initial encounter for closed fracture: Secondary | ICD-10-CM | POA: Diagnosis not present

## 2019-12-10 DIAGNOSIS — I10 Essential (primary) hypertension: Secondary | ICD-10-CM | POA: Diagnosis not present

## 2019-12-10 DIAGNOSIS — R5381 Other malaise: Secondary | ICD-10-CM | POA: Diagnosis not present

## 2019-12-10 NOTE — Telephone Encounter (Signed)
Received call from Beaulah Corin- (OT) needing verbal orders for HHOT 2 Wk 4. The number to contact Darl Pikes is [phone]

## 2019-12-13 NOTE — Progress Notes (Signed)
COMMUNITY PALLIATIVE CARE RN NOTE  PATIENT NAME: Frank Gates. DOB: 27-Nov-1925 MRN: [id number]  PRIMARY CARE PROVIDER: Merlene Laughter, MD  RESPONSIBLE PARTY:  Acct ID - Guarantor Home Phone Work Phone Relationship Acct Type  [id number] Lianne Cure* [phone]  Self P/F     [address], [place], [place] [address]   Covid-19 Pre-screening Negative  PLAN OF CARE and INTERVENTION:  1. ADVANCE CARE PLANNING/GOALS OF CARE: Goal is for patient to transfer to a SNF for rehab. He has a DNR. 2. PATIENT/CAREGIVER EDUCATION: Explained Palliative care services, symptom management 3. DISEASE STATUS: Joint visit made with Palliative care SW, Lynn Duffy. Upon arrival, patient is sitting up in his recliner awake and alert. He is able to answer some simple questions with short replies, but is intermittently confused. He is fidgeting with his covers. Son reports that patient stays busy with his hands. Family is currently awaiting a FL2 to be singed by his PCP in order to place him in a SNF for rehab purposes. [facility] has been working on this, per son. He was admitted to the hospital from 11/21/19 to 11/26/19 after a fall which resulted in a left hip fracture. He underwent a left hip hemiarthroplasty. They are also awaiting a hospital bed, as patient has been having to sit and sleep in a recliner, which makes care difficult. He requires 2-3 person assistance with standing. He is dependent for all ADLs. His intake has deteriorated over the past few weeks and son reports he has lost weight. He does have some issues with swallowing. [facility]. He has PT/OT/RN/CNA. He also has assistance from an aide through [facility]. He is incontinent of both bowel and bladder. He has a large calloused area noted to his left heel. SW contacted and spoke with patient's PCP to follow up on the status of the FL2 and hospital bed. They are to call SW back with this information.  Family is agreeable to future visit from Palliative care. Will continue to monitor.   HISTORY OF PRESENT ILLNESS: This is a 86 yo male who resides at home with a diagnosis of Alzheimer's dementia. He has a history of CAD s/p CABG, HTN, CHF, aortic stenosis, hypercholesterolemia and hip fracture.  Palliative care team asked to follow patient for additional support. Will visit patient monthly and PRN.   CODE STATUS: DNR ADVANCED DIRECTIVES: Y (HCPOA, Living Will) MOST FORM: no PPS: 30%   PHYSICAL EXAM:   VITALS: Today's Vitals   12/06/19 1053  BP: 109/85  Pulse: 98  Resp: 18  Temp: (!) 97 F (36.1 C)  TempSrc: Other (Comment)  PainSc: 0-No pain    LUNGS: clear to auscultation  CARDIAC: Cor RRR EXTREMITIES: No edema SKIN: Callous noted on left heel  NEURO: Alert and oriented to self, intermittent confusion, progressive generalized weakness, total care    (Duration of visit and documentation 75 minutes)   Candiss Norse, RN BSN

## 2019-12-13 NOTE — Telephone Encounter (Signed)
Verbal order left on VM  

## 2019-12-14 DIAGNOSIS — I10 Essential (primary) hypertension: Secondary | ICD-10-CM | POA: Diagnosis not present

## 2019-12-14 DIAGNOSIS — I2510 Atherosclerotic heart disease of native coronary artery without angina pectoris: Secondary | ICD-10-CM | POA: Diagnosis not present

## 2019-12-14 DIAGNOSIS — I5020 Unspecified systolic (congestive) heart failure: Secondary | ICD-10-CM | POA: Diagnosis not present

## 2019-12-14 DIAGNOSIS — S72002A Fracture of unspecified part of neck of left femur, initial encounter for closed fracture: Secondary | ICD-10-CM | POA: Diagnosis not present

## 2019-12-15 ENCOUNTER — Other Ambulatory Visit: Payer: Self-pay

## 2019-12-15 ENCOUNTER — Ambulatory Visit (INDEPENDENT_AMBULATORY_CARE_PROVIDER_SITE_OTHER): Payer: Medicare Other | Admitting: Orthopaedic Surgery

## 2019-12-15 ENCOUNTER — Ambulatory Visit (INDEPENDENT_AMBULATORY_CARE_PROVIDER_SITE_OTHER): Payer: Medicare Other

## 2019-12-15 ENCOUNTER — Encounter: Payer: Self-pay | Admitting: Orthopaedic Surgery

## 2019-12-15 DIAGNOSIS — Z96642 Presence of left artificial hip joint: Secondary | ICD-10-CM

## 2019-12-15 DIAGNOSIS — L89313 Pressure ulcer of right buttock, stage 3: Secondary | ICD-10-CM | POA: Diagnosis not present

## 2019-12-15 DIAGNOSIS — L89626 Pressure-induced deep tissue damage of left heel: Secondary | ICD-10-CM | POA: Diagnosis not present

## 2019-12-15 DIAGNOSIS — S7292XA Unspecified fracture of left femur, initial encounter for closed fracture: Secondary | ICD-10-CM | POA: Diagnosis not present

## 2019-12-15 DIAGNOSIS — M6281 Muscle weakness (generalized): Secondary | ICD-10-CM | POA: Diagnosis not present

## 2019-12-15 NOTE — Progress Notes (Signed)
HPI: Frank Gates returns today 3 weeks status post left hip hemiarthroplasty.  He is had to be moved to a skilled nursing facility due to family unable to care for him at home.  His daughter is here today.  Dannielle Huh has Alzheimer's.  Physical exam: Left hip incision is well approximated with staples.  He has had erythema due to staple irritation.  There is swelling consistent with seroma/hematoma.  Attempted aspiration results and dry aspirate.  No obvious pain with palpation of left calf calf supple.  Left hip gentle range of motion reveals fluid motion.  Radiographs: AP pelvis lateral view left hip shows no acute fractures.  Status post left hip hemiarthroplasty with well located hip and no obvious hardware failure.  Right hip is well located.  Impression: Status post left hip hemiarthroplasty 11/22/2019  Plan: Staples removed Steri-Strips applied.  Is able to get incision wet in shower.  He is weightbearing as tolerated on the left hip benefit from physical therapy for range of motion of the hip strengthening gait and balance.  Follow-up with [place] in 1 month.  Questions were encouraged and answered patient's daughter was present today.

## 2019-12-17 DIAGNOSIS — S72002A Fracture of unspecified part of neck of left femur, initial encounter for closed fracture: Secondary | ICD-10-CM | POA: Diagnosis not present

## 2019-12-17 DIAGNOSIS — G309 Alzheimer's disease, unspecified: Secondary | ICD-10-CM | POA: Diagnosis not present

## 2019-12-17 DIAGNOSIS — F0280 Dementia in other diseases classified elsewhere without behavioral disturbance: Secondary | ICD-10-CM | POA: Diagnosis not present

## 2019-12-17 DIAGNOSIS — R296 Repeated falls: Secondary | ICD-10-CM | POA: Diagnosis not present

## 2019-12-20 ENCOUNTER — Telehealth: Payer: Self-pay | Admitting: Orthopaedic Surgery

## 2019-12-20 DIAGNOSIS — R52 Pain, unspecified: Secondary | ICD-10-CM | POA: Diagnosis not present

## 2019-12-20 DIAGNOSIS — Z79899 Other long term (current) drug therapy: Secondary | ICD-10-CM | POA: Diagnosis not present

## 2019-12-20 DIAGNOSIS — R400 Somnolence: Secondary | ICD-10-CM | POA: Diagnosis not present

## 2019-12-20 NOTE — Telephone Encounter (Signed)
Melissa/[facility]/PT/HH Aide and SocialWorker called needing verbal orders for patient   Nursing 1 week 1 2week 4 1week 4  PT 3week 3 2 week 5  OT 2week 4  Social work 1 momth 1  HH aide 2week 6  Please call Melissa [phone] ext [phone]

## 2019-12-20 NOTE — Telephone Encounter (Signed)
Verbal order given  

## 2019-12-21 ENCOUNTER — Telehealth: Payer: Self-pay

## 2019-12-21 DIAGNOSIS — F0281 Dementia in other diseases classified elsewhere with behavioral disturbance: Secondary | ICD-10-CM | POA: Diagnosis not present

## 2019-12-21 DIAGNOSIS — I130 Hypertensive heart and chronic kidney disease with heart failure and stage 1 through stage 4 chronic kidney disease, or unspecified chronic kidney disease: Secondary | ICD-10-CM | POA: Diagnosis not present

## 2019-12-21 DIAGNOSIS — I509 Heart failure, unspecified: Secondary | ICD-10-CM | POA: Diagnosis not present

## 2019-12-21 DIAGNOSIS — M6281 Muscle weakness (generalized): Secondary | ICD-10-CM | POA: Diagnosis not present

## 2019-12-21 DIAGNOSIS — I350 Nonrheumatic aortic (valve) stenosis: Secondary | ICD-10-CM | POA: Diagnosis not present

## 2019-12-21 DIAGNOSIS — G309 Alzheimer's disease, unspecified: Secondary | ICD-10-CM | POA: Diagnosis not present

## 2019-12-21 DIAGNOSIS — R2689 Other abnormalities of gait and mobility: Secondary | ICD-10-CM | POA: Diagnosis not present

## 2019-12-21 DIAGNOSIS — L89626 Pressure-induced deep tissue damage of left heel: Secondary | ICD-10-CM | POA: Diagnosis not present

## 2019-12-21 DIAGNOSIS — Z8781 Personal history of (healed) traumatic fracture: Secondary | ICD-10-CM | POA: Diagnosis not present

## 2019-12-21 DIAGNOSIS — I2510 Atherosclerotic heart disease of native coronary artery without angina pectoris: Secondary | ICD-10-CM | POA: Diagnosis not present

## 2019-12-21 DIAGNOSIS — Z741 Need for assistance with personal care: Secondary | ICD-10-CM | POA: Diagnosis not present

## 2019-12-21 DIAGNOSIS — E785 Hyperlipidemia, unspecified: Secondary | ICD-10-CM | POA: Diagnosis not present

## 2019-12-21 DIAGNOSIS — S72032D Displaced midcervical fracture of left femur, subsequent encounter for closed fracture with routine healing: Secondary | ICD-10-CM | POA: Diagnosis not present

## 2019-12-21 DIAGNOSIS — N183 Chronic kidney disease, stage 3 unspecified: Secondary | ICD-10-CM | POA: Diagnosis not present

## 2019-12-21 DIAGNOSIS — Z8679 Personal history of other diseases of the circulatory system: Secondary | ICD-10-CM | POA: Diagnosis not present

## 2020-01-13 DEATH — deceased

## 2020-01-17 ENCOUNTER — Ambulatory Visit: Payer: Medicare Other | Admitting: Orthopaedic Surgery

## 2020-05-24 NOTE — Telephone Encounter (Signed)
Opened in error

## 2020-11-13 IMAGING — DX DG HIP (WITH OR WITHOUT PELVIS) 1V PORT*L*
2 series · 2 of 2 positions shown · non-contrast
Comparison: 11/22/2019.

CLINICAL DATA: Postop hip arthroplasty.

EXAM:
DG HIP (WITH OR WITHOUT PELVIS) 1V PORT LEFT

[pelvis ap]
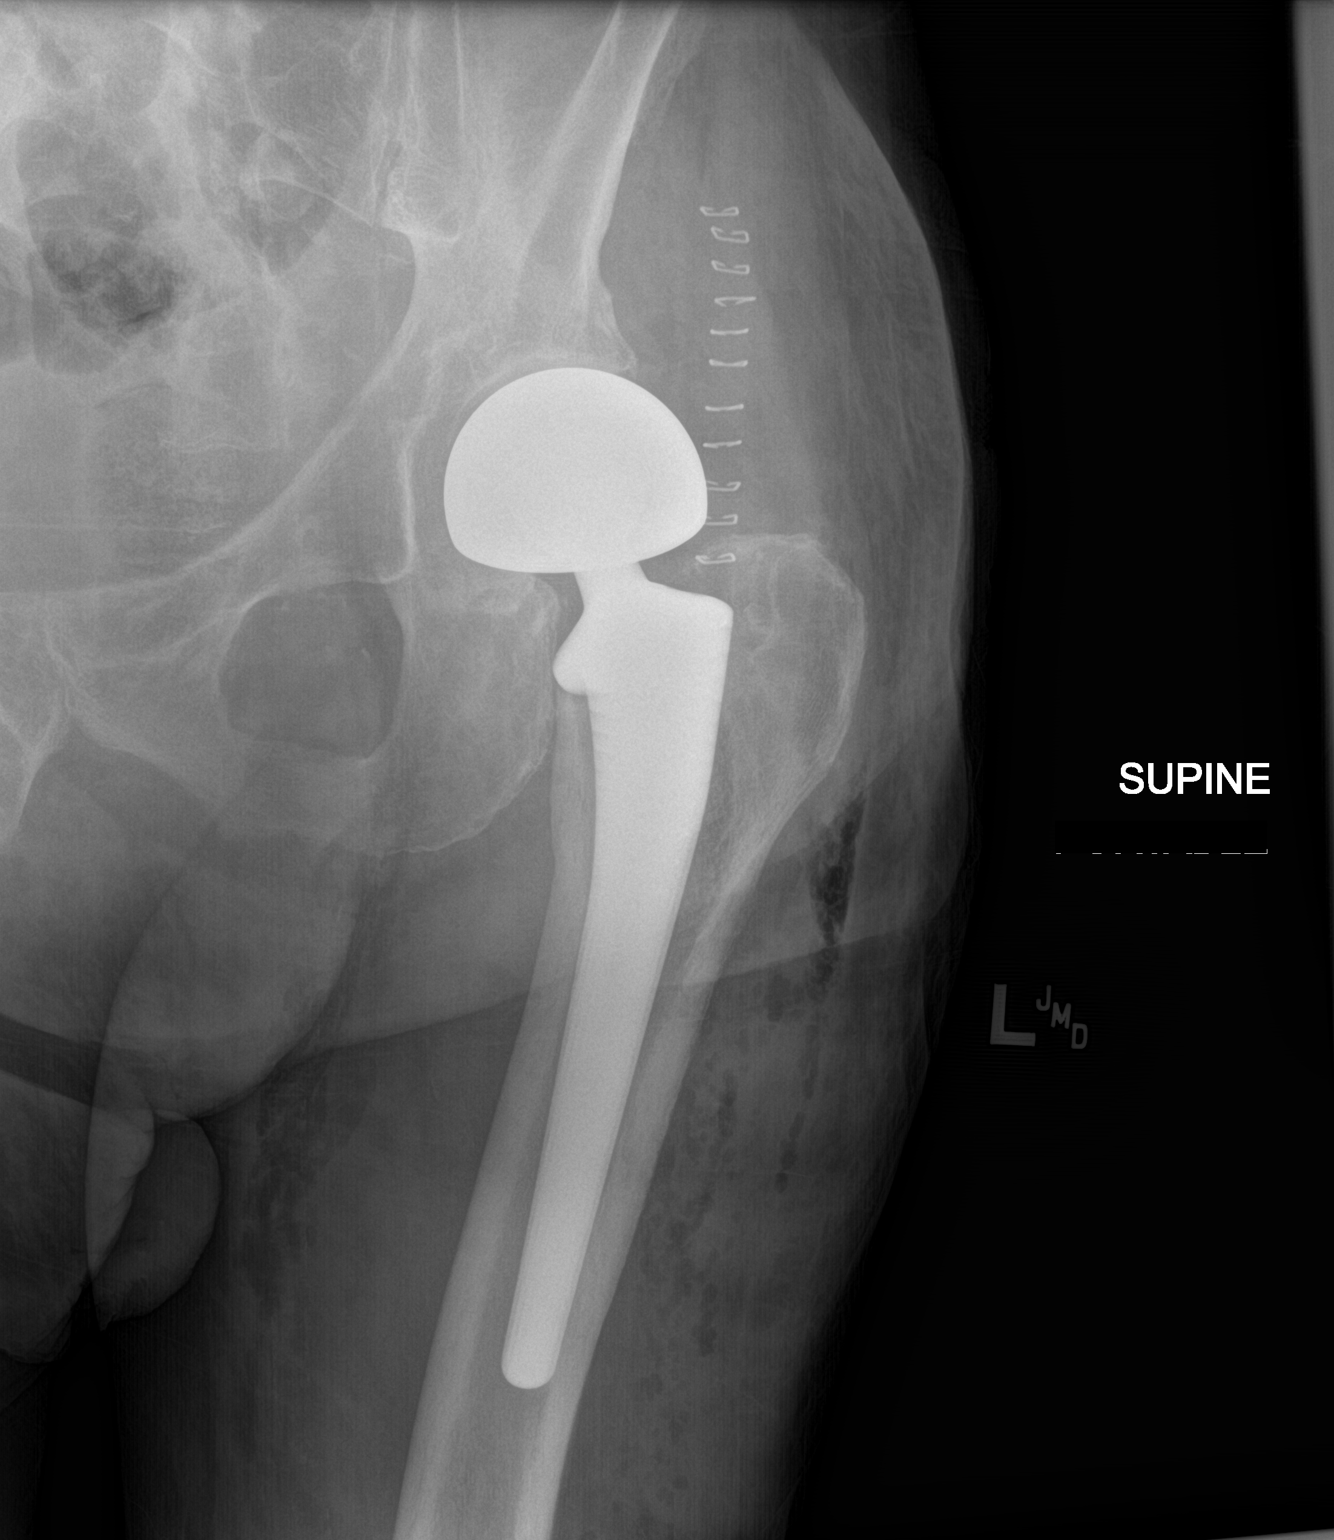

[hip ap]
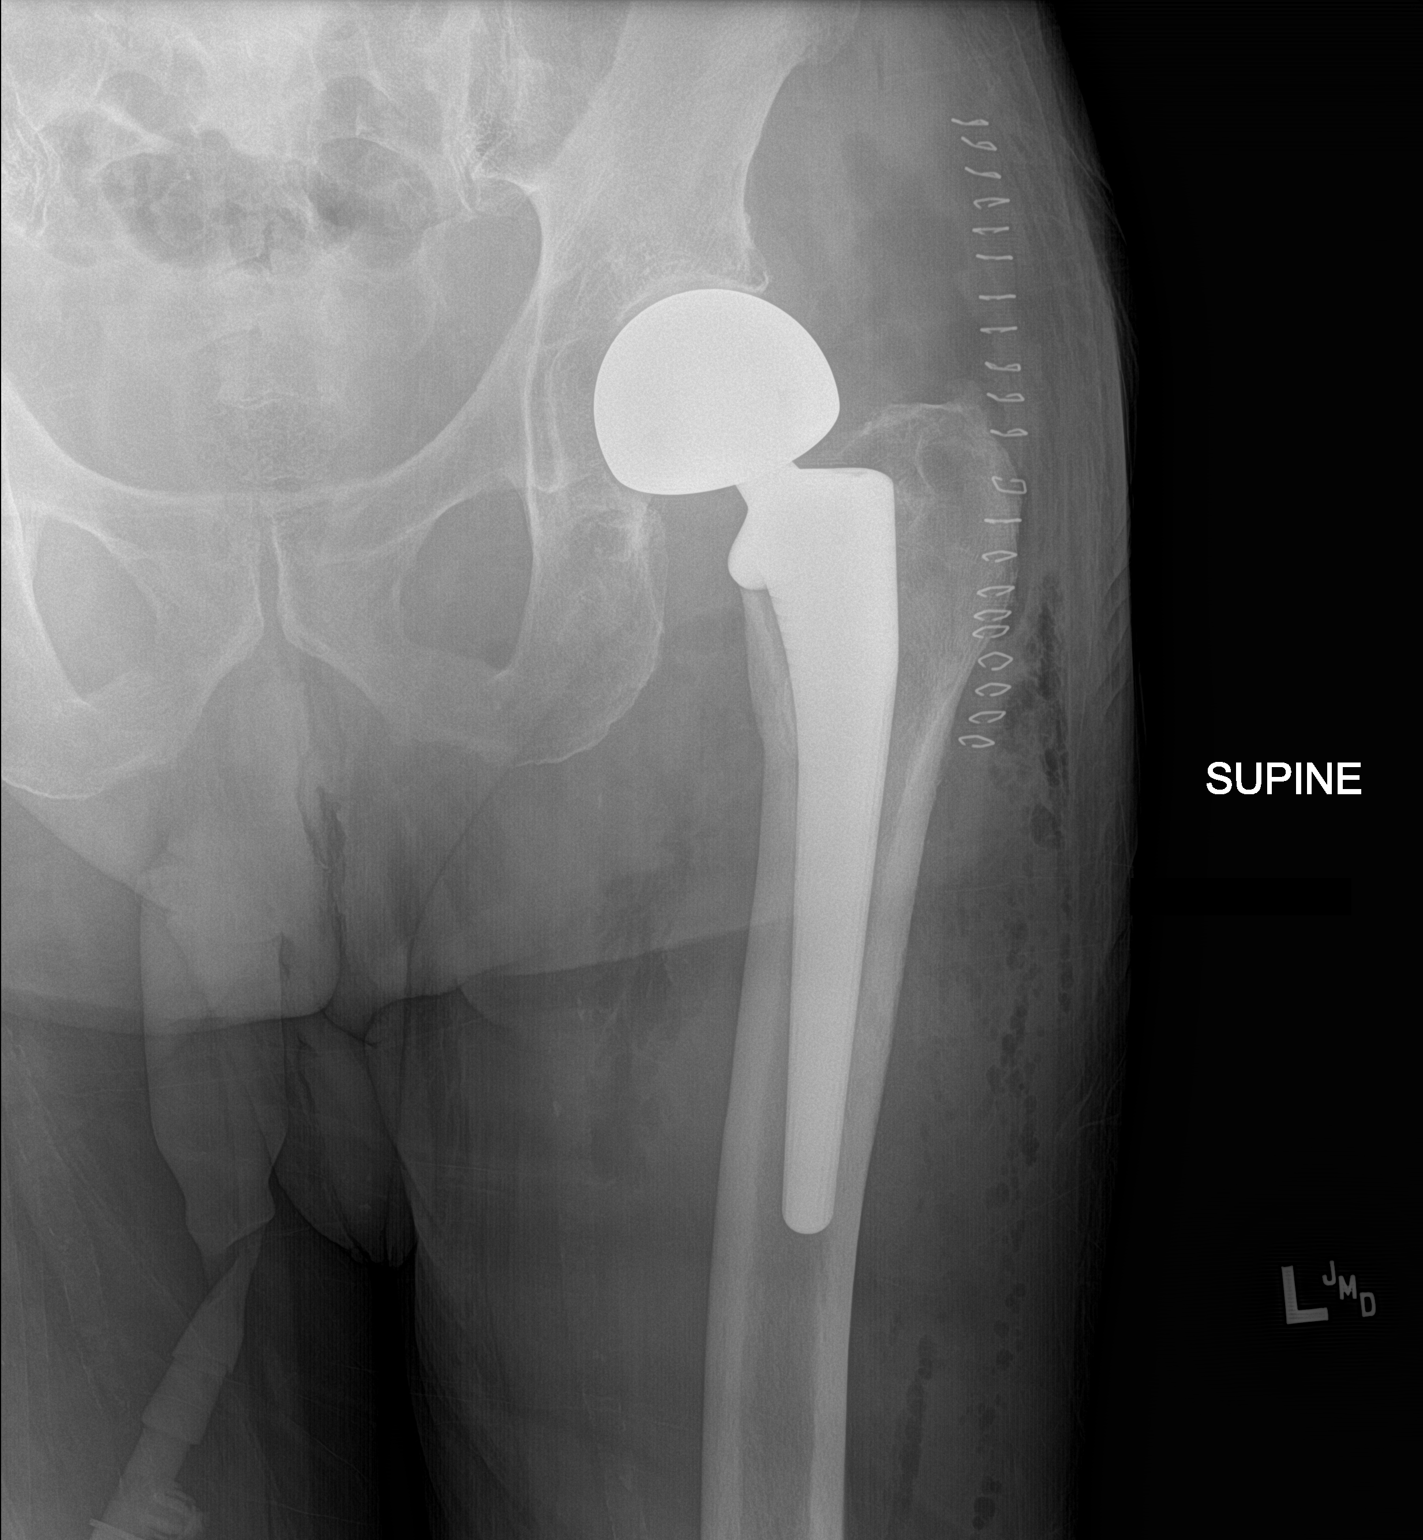

[2 of 2 positions shown; findings below may reference images not displayed]

FINDINGS: Left hip arthroplasty. Femoral stem appears well seated. Surgical
skin staples are in place. Subcutaneous air has decreased somewhat
in the interval. Remainder of the visualized pelvis is unremarkable.
IMPRESSION: Left hip arthroplasty with expected postoperative findings.
